# Patient Record
Sex: Male | Born: 1945 | Race: White | Hispanic: No | Marital: Married | Smoking: Former smoker
Health system: Southern US, Community
[De-identification: ages and names within clinical notes are randomized; demographics above are authoritative.]

## PROBLEM LIST (undated history)

## (undated) DIAGNOSIS — I700 Atherosclerosis of aorta: Secondary | ICD-10-CM

## (undated) DIAGNOSIS — I10 Essential (primary) hypertension: Secondary | ICD-10-CM

## (undated) DIAGNOSIS — I2510 Atherosclerotic heart disease of native coronary artery without angina pectoris: Secondary | ICD-10-CM

## (undated) DIAGNOSIS — R7301 Impaired fasting glucose: Secondary | ICD-10-CM

## (undated) DIAGNOSIS — E785 Hyperlipidemia, unspecified: Secondary | ICD-10-CM

## (undated) DIAGNOSIS — C4491 Basal cell carcinoma of skin, unspecified: Secondary | ICD-10-CM

## (undated) DIAGNOSIS — J449 Chronic obstructive pulmonary disease, unspecified: Secondary | ICD-10-CM

## (undated) DIAGNOSIS — G5600 Carpal tunnel syndrome, unspecified upper limb: Secondary | ICD-10-CM

## (undated) HISTORY — PX: FRACTURE SURGERY: SHX138

## (undated) HISTORY — DX: Atherosclerosis of aorta: I70.0

## (undated) HISTORY — DX: Basal cell carcinoma of skin, unspecified: C44.91

## (undated) HISTORY — DX: Chronic obstructive pulmonary disease, unspecified: J44.9

## (undated) HISTORY — PX: CARPAL TUNNEL RELEASE: SHX101

## (undated) HISTORY — DX: Hyperlipidemia, unspecified: E78.5

## (undated) HISTORY — DX: Carpal tunnel syndrome, unspecified upper limb: G56.00

## (undated) HISTORY — DX: Atherosclerotic heart disease of native coronary artery without angina pectoris: I25.10

## (undated) HISTORY — DX: Impaired fasting glucose: R73.01

---

## 2003-10-17 ENCOUNTER — Encounter: Admission: RE | Admit: 2003-10-17 | Discharge: 2003-10-17 | Payer: Self-pay | Admitting: Internal Medicine

## 2016-07-03 ENCOUNTER — Other Ambulatory Visit: Payer: Self-pay | Admitting: Gastroenterology

## 2016-09-08 ENCOUNTER — Ambulatory Visit (HOSPITAL_COMMUNITY)
Admission: RE | Admit: 2016-09-08 | Discharge: 2016-09-08 | Disposition: A | Discharge status: Home / Self Care | Payer: BLUE CROSS/BLUE SHIELD | Source: Ambulatory Visit | Attending: Gastroenterology | Admitting: Gastroenterology

## 2016-09-08 ENCOUNTER — Ambulatory Visit (HOSPITAL_COMMUNITY): Payer: BLUE CROSS/BLUE SHIELD | Admitting: Anesthesiology

## 2016-09-08 ENCOUNTER — Encounter (HOSPITAL_COMMUNITY)
Admission: RE | Disposition: A | Discharge status: Home / Self Care | Payer: Self-pay | Source: Ambulatory Visit | Attending: Gastroenterology

## 2016-09-08 ENCOUNTER — Encounter (HOSPITAL_COMMUNITY): Payer: Self-pay | Admitting: *Deleted

## 2016-09-08 DIAGNOSIS — Z1211 Encounter for screening for malignant neoplasm of colon: Secondary | ICD-10-CM | POA: Diagnosis not present

## 2016-09-08 DIAGNOSIS — I10 Essential (primary) hypertension: Secondary | ICD-10-CM | POA: Insufficient documentation

## 2016-09-08 DIAGNOSIS — Z7982 Long term (current) use of aspirin: Secondary | ICD-10-CM | POA: Insufficient documentation

## 2016-09-08 DIAGNOSIS — E7800 Pure hypercholesterolemia, unspecified: Secondary | ICD-10-CM | POA: Insufficient documentation

## 2016-09-08 DIAGNOSIS — J45909 Unspecified asthma, uncomplicated: Secondary | ICD-10-CM | POA: Insufficient documentation

## 2016-09-08 DIAGNOSIS — Z79899 Other long term (current) drug therapy: Secondary | ICD-10-CM | POA: Insufficient documentation

## 2016-09-08 DIAGNOSIS — F17200 Nicotine dependence, unspecified, uncomplicated: Secondary | ICD-10-CM | POA: Insufficient documentation

## 2016-09-08 HISTORY — PX: COLONOSCOPY WITH PROPOFOL: SHX5780

## 2016-09-08 HISTORY — DX: Essential (primary) hypertension: I10

## 2016-09-08 SURGERY — COLONOSCOPY WITH PROPOFOL
Anesthesia: Monitor Anesthesia Care

## 2016-09-08 MED ORDER — LACTATED RINGERS IV SOLN
INTRAVENOUS | Status: DC
  Administered 2016-09-08: 11:00:00 via INTRAVENOUS

## 2016-09-08 MED ORDER — LIDOCAINE 2% (20 MG/ML) 5 ML SYRINGE
INTRAMUSCULAR | Status: AC
  Filled 2016-09-08: qty 5

## 2016-09-08 MED ORDER — LIDOCAINE 2% (20 MG/ML) 5 ML SYRINGE
INTRAMUSCULAR | Status: DC | PRN
  Administered 2016-09-08: 60 mg via INTRAVENOUS

## 2016-09-08 MED ORDER — ONDANSETRON HCL 4 MG/2ML IJ SOLN
INTRAMUSCULAR | Status: AC
  Filled 2016-09-08: qty 2

## 2016-09-08 MED ORDER — PROPOFOL 10 MG/ML IV BOLUS
INTRAVENOUS | Status: DC | PRN
  Administered 2016-09-08 (×6): 20 mg via INTRAVENOUS
  Administered 2016-09-08: 40 mg via INTRAVENOUS
  Administered 2016-09-08 (×5): 20 mg via INTRAVENOUS

## 2016-09-08 MED ORDER — PROPOFOL 10 MG/ML IV BOLUS
INTRAVENOUS | Status: AC
  Filled 2016-09-08: qty 20

## 2016-09-08 MED ORDER — SODIUM CHLORIDE 0.9 % IV SOLN: INTRAVENOUS | Status: DC

## 2016-09-08 MED ORDER — ONDANSETRON HCL 4 MG/2ML IJ SOLN
INTRAMUSCULAR | Status: DC | PRN
  Administered 2016-09-08: 4 mg via INTRAVENOUS

## 2016-09-08 SURGICAL SUPPLY — 22 items

## 2016-09-08 NOTE — Anesthesia Postprocedure Evaluation (Signed)
Anesthesia Post Note  Patient: Phillip Lam  Procedure(s) Performed: Procedure(s) (LRB): COLONOSCOPY WITH PROPOFOL (N/A)     Patient location during evaluation: PACU Anesthesia Type: MAC Level of consciousness: awake and alert Pain management: pain level controlled Vital Signs Assessment: post-procedure vital signs reviewed and stable Respiratory status: spontaneous breathing, nonlabored ventilation, respiratory function stable and patient connected to nasal cannula oxygen Cardiovascular status: stable and blood pressure returned to baseline Anesthetic complications: no    Last Vitals:  Vitals:   09/08/16 1320 09/08/16 1330  BP: 127/79 127/67  Pulse: (!) 59 (!) 56  Resp: 14 12  Temp:      Last Pain:  Vitals:   09/08/16 1302  TempSrc: Oral                 [name]

## 2016-09-08 NOTE — H&P (Signed)
Procedure: Screening colonoscopy. 03/30/2006 normal screening colonoscopy was performed  History: The patient is a [age] year old male born September 04, 1945. He is scheduled to undergo a repeat screening colonoscopy today.  Past medical history: Hypertension. Asthma. Hypercholesterolemia. Carpal tunnel syndrome. Left elbow surgery. Bilateral carpal tunnel release surgeries.  Exam: The patient is alert and lying comfortably on the endoscopy stretcher. Abdomen is soft and nontender to palpation. Lungs are clear to auscultation. Cardiac exam reveals a regular rhythm.  Plan: Proceed with screening colonoscopy

## 2016-09-08 NOTE — Discharge Instructions (Signed)

## 2016-09-08 NOTE — Op Note (Signed)
[facility] Patient Name: Phillip Lam Procedure Date: 09/08/2016 MRN: [id number] Attending MD: Charolett Bumpers , MD Date of Birth: [birth year] CSN: [id number] Age: 71 Admit Type: Outpatient Procedure:                Colonoscopy Indications:              Screening for colorectal malignant neoplasm.                            03/30/2006 normal screening colonoscopy was performed. Providers:                Charolett Bumpers, MD, Carin Hock, RN, Beryle Beams, Technician, Doreene Burke, CRNA Referring MD:              Medicines:                Propofol per Anesthesia Complications:            No immediate complications. Estimated Blood Loss:     Estimated blood loss: none. Procedure:                Pre-Anesthesia Assessment:                           - Prior to the procedure, a History and Physical                            was performed, and patient medications and                            allergies were reviewed. The patient's tolerance of                            previous anesthesia was also reviewed. The risks                            and benefits of the procedure and the sedation                            options and risks were discussed with the patient.                            All questions were answered, and informed consent                            was obtained. Prior Anticoagulants: The patient has                            taken no previous anticoagulant or antiplatelet                            agents. ASA Grade Assessment: II - A patient with  mild systemic disease. After reviewing the risks                            and benefits, the patient was deemed in                            satisfactory condition to undergo the procedure.                           After obtaining informed consent, the colonoscope                            was passed under direct vision. Throughout the                    procedure, the patient's blood pressure, pulse, and                            oxygen saturations were monitored continuously. The                            EC-[id number] ([id number]) scope was introduced through                            the anus and advanced to the the cecum, identified                            by appendiceal orifice and ileocecal valve. The                            colonoscopy was performed without difficulty. The                            patient tolerated the procedure well. The quality                            of the bowel preparation was good. The ileocecal                            valve, the appendiceal orifice and the rectum were                            photographed. Scope In: 12:34:03 PM Scope Out: 12:55:02 PM Scope Withdrawal Time: 0 hours 12 minutes 4 seconds  Total Procedure Duration: 0 hours 20 minutes 59 seconds  Findings:      The perianal and digital rectal examinations were normal.      The entire examined colon appeared normal. Impression:               - The entire examined colon is normal.                           - No specimens collected. Moderate Sedation:      N/A- Per Anesthesia Care Recommendation:           - Patient has a contact number  available for                            emergencies. The signs and symptoms of potential                            delayed complications were discussed with the                            patient. Return to normal activities tomorrow.                            Written discharge instructions were provided to the                            patient.                           - Repeat colonoscopy is not recommended for                            screening purposes.                           - Resume previous diet.                           - Continue present medications. Procedure Code(s):        --- Professional ---                           [id number], Colorectal cancer screening;  colonoscopy on                            individual not meeting criteria for high risk Diagnosis Code(s):        --- Professional ---                           Z12.11, Encounter for screening for malignant                            neoplasm of colon CPT copyright 2016 American Medical Association. All rights reserved. The codes documented in this report are preliminary and upon coder review may  be revised to meet current compliance requirements. Danise EdgeMartin Maddix Heinz, MD Charolett BumpersMartin K Shyheem Whitham, MD 09/08/2016 1:00:49 PM This report has been signed electronically. Number of Addenda: 0

## 2016-09-08 NOTE — Anesthesia Preprocedure Evaluation (Addendum)
Anesthesia Evaluation  Patient identified by MRN, date of birth, ID band Patient awake    Reviewed: Allergy & Precautions, NPO status , Patient's Chart, lab work & pertinent test results  Airway Mallampati: I  TM Distance: >3 FB Neck ROM: Full    Dental  (+) Upper Dentures, Lower Dentures   Pulmonary Current Smoker,    Pulmonary exam normal        Cardiovascular hypertension, Pt. on medications  Rhythm:Regular Rate:Normal     Neuro/Psych negative neurological ROS  negative psych ROS   GI/Hepatic negative GI ROS, Neg liver ROS,   Endo/Other  negative endocrine ROS  Renal/GU negative Renal ROS  negative genitourinary   Musculoskeletal negative musculoskeletal ROS (+)   Abdominal   Peds negative pediatric ROS (+)  Hematology negative hematology ROS (+)   Anesthesia Other Findings   Reproductive/Obstetrics negative OB ROS                           Anesthesia Physical Anesthesia Plan  ASA: II  Anesthesia Plan: MAC   Post-op Pain Management:    Induction: Intravenous  PONV Risk Score and Plan: Propofol  Airway Management Planned:   Additional Equipment:   Intra-op Plan:   Post-operative Plan:   Informed Consent: I have reviewed the patients History and Physical, chart, labs and discussed the procedure including the risks, benefits and alternatives for the proposed anesthesia with the patient or authorized representative who has indicated his/her understanding and acceptance.     Plan Discussed with:   Anesthesia Plan Comments:         Anesthesia Quick Evaluation

## 2016-09-08 NOTE — Transfer of Care (Signed)
Immediate Anesthesia Transfer of Care Note  Patient: Phillip Lam  Procedure(s) Performed: Procedure(s): COLONOSCOPY WITH PROPOFOL (N/A)  Patient Location: PACU  Anesthesia Type:MAC  Level of Consciousness:  sedated, patient cooperative and responds to stimulation  Airway & Oxygen Therapy:Patient Spontanous Breathing and Patient connected to face mask oxgen  Post-op Assessment:  Report given to PACU RN and Post -op Vital signs reviewed and stable  Post vital signs:  Reviewed and stable  Last Vitals:  Vitals:   09/08/16 1120  BP: (!) 142/84  Pulse: 68  Resp: 16  Temp: 15.4 C    Complications: No apparent anesthesia complications

## 2016-09-10 ENCOUNTER — Encounter (HOSPITAL_COMMUNITY): Payer: Self-pay | Admitting: Gastroenterology

## 2016-12-25 DIAGNOSIS — H6982 Other specified disorders of Eustachian tube, left ear: Secondary | ICD-10-CM | POA: Diagnosis not present

## 2017-01-02 DIAGNOSIS — H6123 Impacted cerumen, bilateral: Secondary | ICD-10-CM | POA: Diagnosis not present

## 2017-01-02 DIAGNOSIS — H9192 Unspecified hearing loss, left ear: Secondary | ICD-10-CM | POA: Diagnosis not present

## 2017-01-02 DIAGNOSIS — H9202 Otalgia, left ear: Secondary | ICD-10-CM | POA: Insufficient documentation

## 2017-01-06 DIAGNOSIS — Z77122 Contact with and (suspected) exposure to noise: Secondary | ICD-10-CM | POA: Diagnosis not present

## 2017-01-06 DIAGNOSIS — H93292 Other abnormal auditory perceptions, left ear: Secondary | ICD-10-CM | POA: Insufficient documentation

## 2017-01-06 DIAGNOSIS — Z87891 Personal history of nicotine dependence: Secondary | ICD-10-CM | POA: Diagnosis not present

## 2017-01-06 DIAGNOSIS — H6122 Impacted cerumen, left ear: Secondary | ICD-10-CM | POA: Diagnosis not present

## 2017-04-20 ENCOUNTER — Other Ambulatory Visit: Payer: Self-pay | Admitting: Internal Medicine

## 2017-04-20 DIAGNOSIS — J449 Chronic obstructive pulmonary disease, unspecified: Secondary | ICD-10-CM | POA: Diagnosis not present

## 2017-04-20 DIAGNOSIS — R0609 Other forms of dyspnea: Secondary | ICD-10-CM | POA: Diagnosis not present

## 2017-04-20 DIAGNOSIS — I10 Essential (primary) hypertension: Secondary | ICD-10-CM | POA: Diagnosis not present

## 2017-04-20 DIAGNOSIS — E669 Obesity, unspecified: Secondary | ICD-10-CM | POA: Diagnosis not present

## 2017-04-20 DIAGNOSIS — Z0000 Encounter for general adult medical examination without abnormal findings: Secondary | ICD-10-CM | POA: Diagnosis not present

## 2017-04-20 DIAGNOSIS — L989 Disorder of the skin and subcutaneous tissue, unspecified: Secondary | ICD-10-CM | POA: Diagnosis not present

## 2017-04-20 DIAGNOSIS — Z1389 Encounter for screening for other disorder: Secondary | ICD-10-CM | POA: Diagnosis not present

## 2017-04-20 DIAGNOSIS — G479 Sleep disorder, unspecified: Secondary | ICD-10-CM | POA: Diagnosis not present

## 2017-04-20 DIAGNOSIS — E7800 Pure hypercholesterolemia, unspecified: Secondary | ICD-10-CM | POA: Diagnosis not present

## 2017-04-27 ENCOUNTER — Ambulatory Visit
Admission: RE | Admit: 2017-04-27 | Discharge: 2017-04-27 | Disposition: A | Discharge status: Home / Self Care | Payer: Medicare Other | Source: Ambulatory Visit | Attending: Internal Medicine | Admitting: Internal Medicine

## 2017-04-27 DIAGNOSIS — R0609 Other forms of dyspnea: Principal | ICD-10-CM

## 2017-04-27 DIAGNOSIS — I77811 Abdominal aortic ectasia: Secondary | ICD-10-CM | POA: Diagnosis not present

## 2017-04-28 ENCOUNTER — Other Ambulatory Visit: Payer: Self-pay | Admitting: Internal Medicine

## 2017-04-28 DIAGNOSIS — Z136 Encounter for screening for cardiovascular disorders: Secondary | ICD-10-CM

## 2017-05-04 DIAGNOSIS — R0609 Other forms of dyspnea: Secondary | ICD-10-CM | POA: Diagnosis not present

## 2017-11-10 DIAGNOSIS — I10 Essential (primary) hypertension: Secondary | ICD-10-CM | POA: Diagnosis not present

## 2017-11-10 DIAGNOSIS — R0609 Other forms of dyspnea: Secondary | ICD-10-CM | POA: Diagnosis not present

## 2017-11-10 DIAGNOSIS — J449 Chronic obstructive pulmonary disease, unspecified: Secondary | ICD-10-CM | POA: Diagnosis not present

## 2017-11-10 DIAGNOSIS — J45909 Unspecified asthma, uncomplicated: Secondary | ICD-10-CM | POA: Diagnosis not present

## 2017-11-10 DIAGNOSIS — E7800 Pure hypercholesterolemia, unspecified: Secondary | ICD-10-CM | POA: Diagnosis not present

## 2017-11-16 DIAGNOSIS — J449 Chronic obstructive pulmonary disease, unspecified: Secondary | ICD-10-CM | POA: Diagnosis not present

## 2017-11-16 DIAGNOSIS — J45909 Unspecified asthma, uncomplicated: Secondary | ICD-10-CM | POA: Diagnosis not present

## 2017-11-16 DIAGNOSIS — I10 Essential (primary) hypertension: Secondary | ICD-10-CM | POA: Diagnosis not present

## 2018-04-11 IMAGING — US US AORTA
1 series · 10 of 10 positions shown · non-contrast
Comparison: None.

CLINICAL DATA: 71-year-old male with hypertension. Initial
encounter.

EXAM:
ULTRASOUND OF ABDOMINAL AORTA
TECHNIQUE: Ultrasound examination of the abdominal aorta was performed to
evaluate for abdominal aortic aneurysm.

[Series 1: us aorta · 0.41mm/px · 10 of 10 slices shown]
[im 1/10]
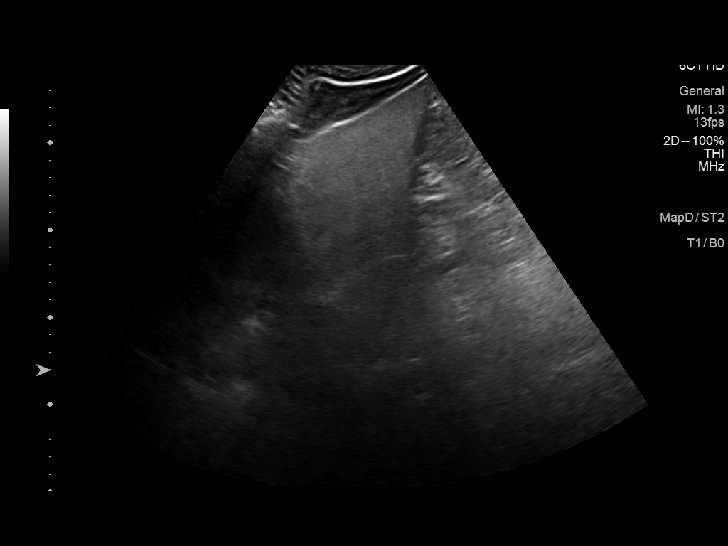
[im 2/10]
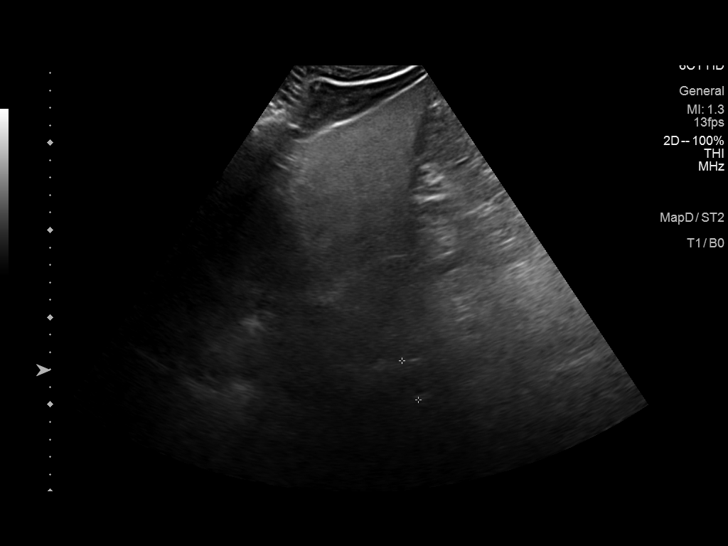
[im 3/10]
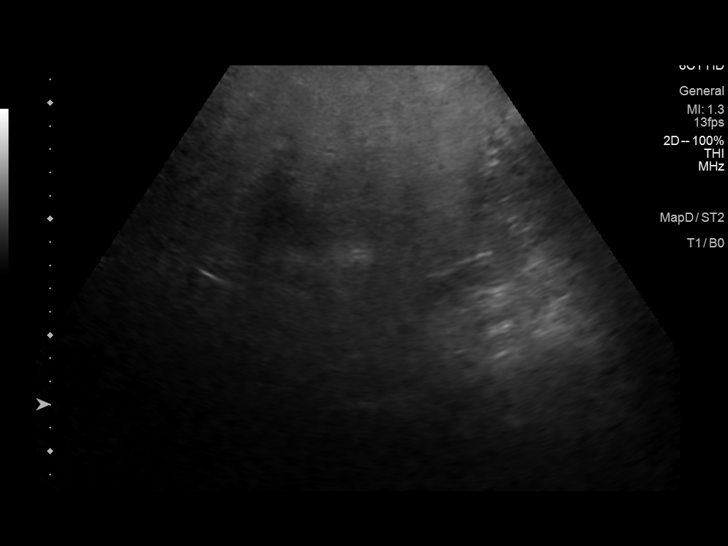
[im 4/10]
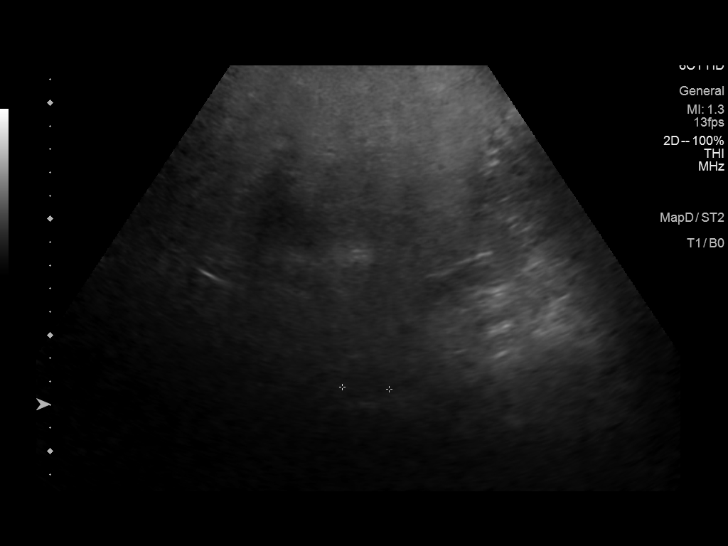
[im 5/10]
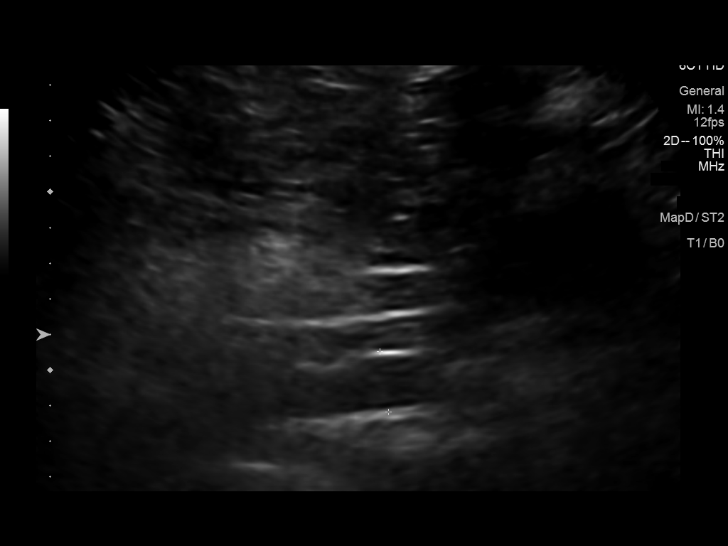
[im 6/10]
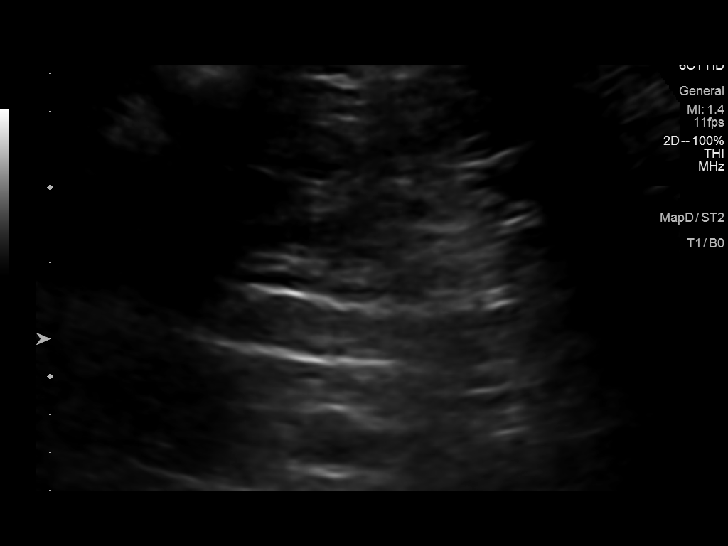
[im 7/10]
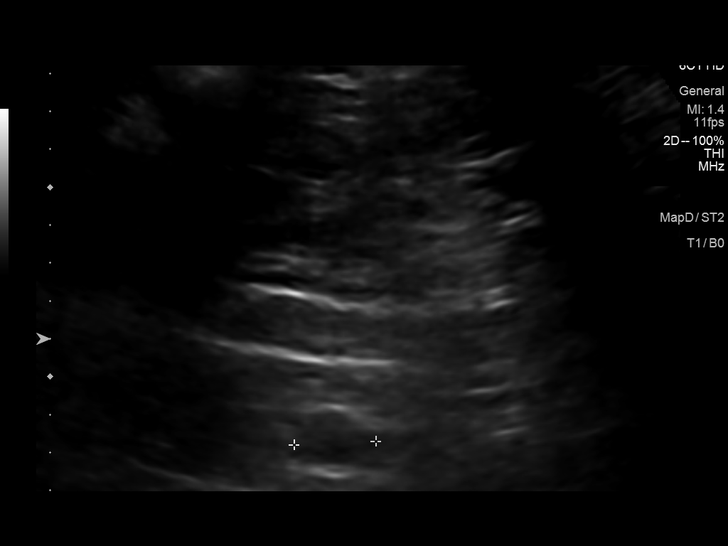
[im 8/10]
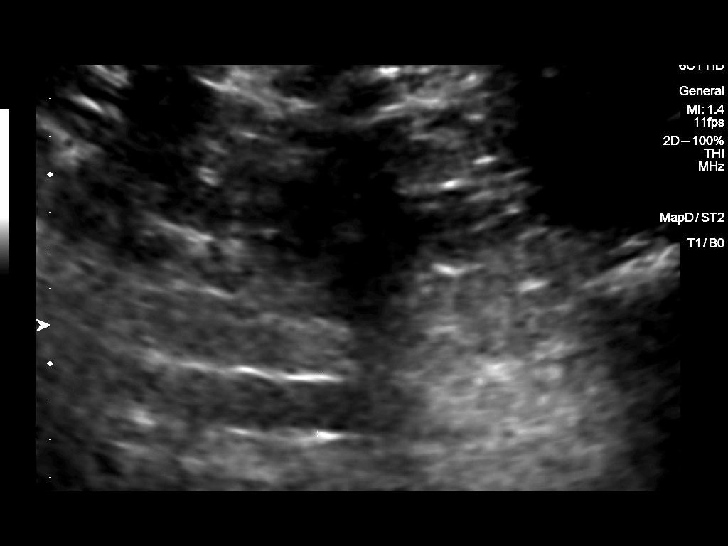
[im 9/10]
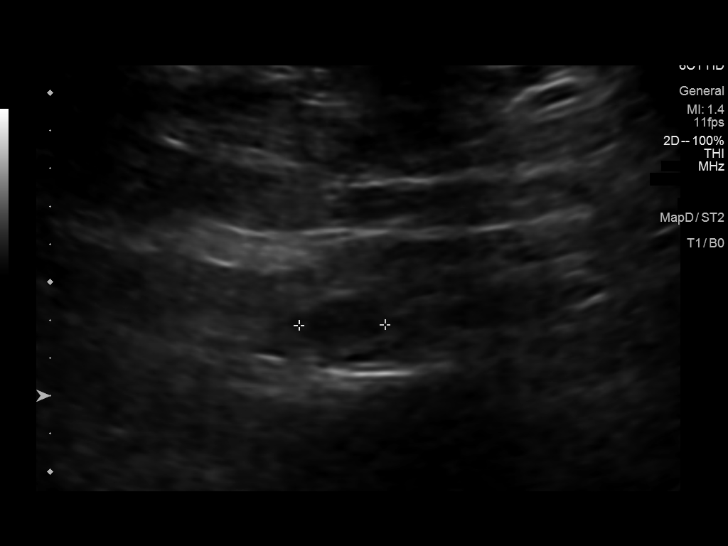
[im 10/10]
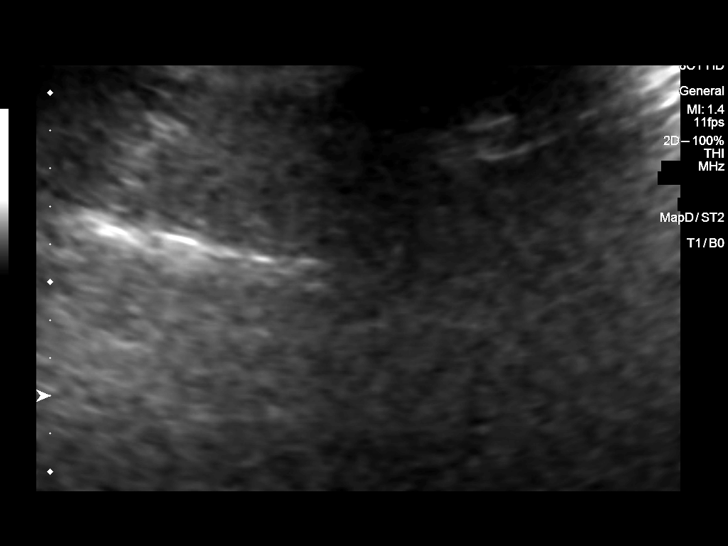

[10 of 10 positions shown; findings below may reference images not displayed]

FINDINGS: Abdominal aortic measurements as follows:

Proximal:  2.4 x 2 cm

Mid:  1.7 x 2.1 cm

Distal:  1.6 x 2.2 cm
IMPRESSION: Exam limited by habitus. Minimal ectasia distal abdominal aorta. No
abdominal aortic aneurysm identified.

## 2018-04-19 DIAGNOSIS — Z1159 Encounter for screening for other viral diseases: Secondary | ICD-10-CM | POA: Diagnosis not present

## 2018-04-19 DIAGNOSIS — J449 Chronic obstructive pulmonary disease, unspecified: Secondary | ICD-10-CM | POA: Diagnosis not present

## 2018-04-19 DIAGNOSIS — E7800 Pure hypercholesterolemia, unspecified: Secondary | ICD-10-CM | POA: Diagnosis not present

## 2018-04-19 DIAGNOSIS — J45909 Unspecified asthma, uncomplicated: Secondary | ICD-10-CM | POA: Diagnosis not present

## 2018-04-19 DIAGNOSIS — Z0000 Encounter for general adult medical examination without abnormal findings: Secondary | ICD-10-CM | POA: Diagnosis not present

## 2018-04-19 DIAGNOSIS — Z1389 Encounter for screening for other disorder: Secondary | ICD-10-CM | POA: Diagnosis not present

## 2018-04-19 DIAGNOSIS — I10 Essential (primary) hypertension: Secondary | ICD-10-CM | POA: Diagnosis not present

## 2018-06-23 DIAGNOSIS — R55 Syncope and collapse: Secondary | ICD-10-CM | POA: Diagnosis not present

## 2018-06-23 DIAGNOSIS — S0501XA Injury of conjunctiva and corneal abrasion without foreign body, right eye, initial encounter: Secondary | ICD-10-CM | POA: Diagnosis not present

## 2018-06-24 DIAGNOSIS — E119 Type 2 diabetes mellitus without complications: Secondary | ICD-10-CM | POA: Diagnosis not present

## 2018-06-24 DIAGNOSIS — N390 Urinary tract infection, site not specified: Secondary | ICD-10-CM | POA: Diagnosis not present

## 2018-06-24 DIAGNOSIS — K449 Diaphragmatic hernia without obstruction or gangrene: Secondary | ICD-10-CM | POA: Diagnosis not present

## 2018-06-24 DIAGNOSIS — S0501XA Injury of conjunctiva and corneal abrasion without foreign body, right eye, initial encounter: Secondary | ICD-10-CM | POA: Diagnosis not present

## 2018-06-30 DIAGNOSIS — J45909 Unspecified asthma, uncomplicated: Secondary | ICD-10-CM | POA: Diagnosis not present

## 2018-06-30 DIAGNOSIS — I10 Essential (primary) hypertension: Secondary | ICD-10-CM | POA: Diagnosis not present

## 2018-06-30 DIAGNOSIS — J449 Chronic obstructive pulmonary disease, unspecified: Secondary | ICD-10-CM | POA: Diagnosis not present

## 2018-08-02 DIAGNOSIS — J45909 Unspecified asthma, uncomplicated: Secondary | ICD-10-CM | POA: Diagnosis not present

## 2018-08-02 DIAGNOSIS — J449 Chronic obstructive pulmonary disease, unspecified: Secondary | ICD-10-CM | POA: Diagnosis not present

## 2018-08-02 DIAGNOSIS — I10 Essential (primary) hypertension: Secondary | ICD-10-CM | POA: Diagnosis not present

## 2018-10-15 DIAGNOSIS — J45909 Unspecified asthma, uncomplicated: Secondary | ICD-10-CM | POA: Diagnosis not present

## 2018-10-15 DIAGNOSIS — J449 Chronic obstructive pulmonary disease, unspecified: Secondary | ICD-10-CM | POA: Diagnosis not present

## 2018-10-15 DIAGNOSIS — I10 Essential (primary) hypertension: Secondary | ICD-10-CM | POA: Diagnosis not present

## 2019-02-28 DIAGNOSIS — Z23 Encounter for immunization: Secondary | ICD-10-CM | POA: Diagnosis not present

## 2019-04-22 DIAGNOSIS — I872 Venous insufficiency (chronic) (peripheral): Secondary | ICD-10-CM | POA: Diagnosis not present

## 2019-04-22 DIAGNOSIS — Z0000 Encounter for general adult medical examination without abnormal findings: Secondary | ICD-10-CM | POA: Diagnosis not present

## 2019-04-22 DIAGNOSIS — J45909 Unspecified asthma, uncomplicated: Secondary | ICD-10-CM | POA: Diagnosis not present

## 2019-04-22 DIAGNOSIS — Z6836 Body mass index (BMI) 36.0-36.9, adult: Secondary | ICD-10-CM | POA: Diagnosis not present

## 2019-04-22 DIAGNOSIS — I10 Essential (primary) hypertension: Secondary | ICD-10-CM | POA: Diagnosis not present

## 2019-04-22 DIAGNOSIS — Z1389 Encounter for screening for other disorder: Secondary | ICD-10-CM | POA: Diagnosis not present

## 2019-04-22 DIAGNOSIS — E7800 Pure hypercholesterolemia, unspecified: Secondary | ICD-10-CM | POA: Diagnosis not present

## 2020-04-26 DIAGNOSIS — E7800 Pure hypercholesterolemia, unspecified: Secondary | ICD-10-CM | POA: Diagnosis not present

## 2020-04-26 DIAGNOSIS — J449 Chronic obstructive pulmonary disease, unspecified: Secondary | ICD-10-CM | POA: Diagnosis not present

## 2020-04-26 DIAGNOSIS — Z0000 Encounter for general adult medical examination without abnormal findings: Secondary | ICD-10-CM | POA: Diagnosis not present

## 2020-04-26 DIAGNOSIS — J45909 Unspecified asthma, uncomplicated: Secondary | ICD-10-CM | POA: Diagnosis not present

## 2020-04-26 DIAGNOSIS — Z1389 Encounter for screening for other disorder: Secondary | ICD-10-CM | POA: Diagnosis not present

## 2020-04-26 DIAGNOSIS — I10 Essential (primary) hypertension: Secondary | ICD-10-CM | POA: Diagnosis not present

## 2020-04-26 DIAGNOSIS — Z6839 Body mass index (BMI) 39.0-39.9, adult: Secondary | ICD-10-CM | POA: Diagnosis not present

## 2020-05-28 DIAGNOSIS — Z5181 Encounter for therapeutic drug level monitoring: Secondary | ICD-10-CM | POA: Diagnosis not present

## 2020-11-12 DIAGNOSIS — H25813 Combined forms of age-related cataract, bilateral: Secondary | ICD-10-CM | POA: Diagnosis not present

## 2020-11-27 DIAGNOSIS — I10 Essential (primary) hypertension: Secondary | ICD-10-CM | POA: Diagnosis not present

## 2020-11-27 DIAGNOSIS — J45909 Unspecified asthma, uncomplicated: Secondary | ICD-10-CM | POA: Diagnosis not present

## 2020-11-27 DIAGNOSIS — R739 Hyperglycemia, unspecified: Secondary | ICD-10-CM | POA: Diagnosis not present

## 2020-11-27 DIAGNOSIS — J449 Chronic obstructive pulmonary disease, unspecified: Secondary | ICD-10-CM | POA: Diagnosis not present

## 2020-11-27 DIAGNOSIS — E7800 Pure hypercholesterolemia, unspecified: Secondary | ICD-10-CM | POA: Diagnosis not present

## 2021-04-30 DIAGNOSIS — J449 Chronic obstructive pulmonary disease, unspecified: Secondary | ICD-10-CM | POA: Diagnosis not present

## 2021-04-30 DIAGNOSIS — Z6836 Body mass index (BMI) 36.0-36.9, adult: Secondary | ICD-10-CM | POA: Diagnosis not present

## 2021-04-30 DIAGNOSIS — R7301 Impaired fasting glucose: Secondary | ICD-10-CM | POA: Diagnosis not present

## 2021-04-30 DIAGNOSIS — Z1389 Encounter for screening for other disorder: Secondary | ICD-10-CM | POA: Diagnosis not present

## 2021-04-30 DIAGNOSIS — Z0000 Encounter for general adult medical examination without abnormal findings: Secondary | ICD-10-CM | POA: Diagnosis not present

## 2021-04-30 DIAGNOSIS — I10 Essential (primary) hypertension: Secondary | ICD-10-CM | POA: Diagnosis not present

## 2021-04-30 DIAGNOSIS — J45909 Unspecified asthma, uncomplicated: Secondary | ICD-10-CM | POA: Diagnosis not present

## 2021-04-30 DIAGNOSIS — E7800 Pure hypercholesterolemia, unspecified: Secondary | ICD-10-CM | POA: Diagnosis not present

## 2021-05-01 ENCOUNTER — Other Ambulatory Visit: Payer: Self-pay | Admitting: Internal Medicine

## 2021-05-01 DIAGNOSIS — E7800 Pure hypercholesterolemia, unspecified: Secondary | ICD-10-CM

## 2021-05-21 DIAGNOSIS — R7301 Impaired fasting glucose: Secondary | ICD-10-CM | POA: Diagnosis not present

## 2021-05-31 ENCOUNTER — Other Ambulatory Visit: Payer: Self-pay

## 2021-05-31 ENCOUNTER — Ambulatory Visit
Admission: RE | Admit: 2021-05-31 | Discharge: 2021-05-31 | Disposition: A | Discharge status: Home / Self Care | Payer: No Typology Code available for payment source | Source: Ambulatory Visit | Attending: Internal Medicine | Admitting: Internal Medicine

## 2021-05-31 DIAGNOSIS — E7800 Pure hypercholesterolemia, unspecified: Secondary | ICD-10-CM | POA: Diagnosis not present

## 2021-07-12 ENCOUNTER — Ambulatory Visit (INDEPENDENT_AMBULATORY_CARE_PROVIDER_SITE_OTHER): Payer: PPO | Admitting: Internal Medicine

## 2021-07-12 ENCOUNTER — Encounter: Payer: Self-pay | Admitting: Internal Medicine

## 2021-07-12 VITALS — BP 130/74 | HR 67 | Ht 66.0 in | Wt 228.0 lb

## 2021-07-12 DIAGNOSIS — R0609 Other forms of dyspnea: Secondary | ICD-10-CM | POA: Diagnosis not present

## 2021-07-12 DIAGNOSIS — I700 Atherosclerosis of aorta: Secondary | ICD-10-CM

## 2021-07-12 DIAGNOSIS — R0789 Other chest pain: Secondary | ICD-10-CM | POA: Diagnosis not present

## 2021-07-12 DIAGNOSIS — I2510 Atherosclerotic heart disease of native coronary artery without angina pectoris: Secondary | ICD-10-CM | POA: Insufficient documentation

## 2021-07-12 DIAGNOSIS — I2584 Coronary atherosclerosis due to calcified coronary lesion: Secondary | ICD-10-CM | POA: Diagnosis not present

## 2021-07-12 DIAGNOSIS — E785 Hyperlipidemia, unspecified: Secondary | ICD-10-CM

## 2021-07-12 DIAGNOSIS — I10 Essential (primary) hypertension: Secondary | ICD-10-CM | POA: Diagnosis not present

## 2021-07-12 DIAGNOSIS — E782 Mixed hyperlipidemia: Secondary | ICD-10-CM | POA: Insufficient documentation

## 2021-07-12 NOTE — Patient Instructions (Addendum)
Medication Instructions:  ? ?Your physician recommends that you continue on your current medications as directed. Please refer to the Current Medication list given to you today. ? ?*If you need a refill on your cardiac medications before your next appointment, please call your pharmacy* ? ? ?Lab Work: ? ?None ordered ? ?Testing/Procedures: ? ?[facility] MYOVIEW ? ?Your caregiver has ordered a Stress Test with nuclear imaging (Lexiscan Myoview). The purpose of this test is to evaluate the blood supply to your heart muscle. This procedure is referred to as a "Non-Invasive Stress Test." This is because other than having an IV started in your vein, nothing is inserted or "invades" your body. Cardiac stress tests are done to find areas of poor blood flow to the heart by determining the extent of coronary artery disease (CAD). Some patients exercise on a treadmill, which naturally increases the blood flow to your heart, while others who are  unable to walk on a treadmill due to physical limitations have a pharmacologic/chemical stress agent called Lexiscan . This medicine will mimic walking on a treadmill by temporarily increasing your coronary blood flow.  ? ?Please note: these test may take anywhere between 2-4 hours to complete ? ?PLEASE REPORT TO [facility] MEDICAL MALL ENTRANCE  ?THE VOLUNTEERS AT THE FIRST DESK WILL DIRECT YOU WHERE TO GO ? ?Date of Procedure:_____________________________________ ? ?Arrival Time for Procedure:______________________________ ? ? ?PLEASE NOTIFY THE OFFICE AT LEAST 24 HOURS IN ADVANCE IF YOU ARE UNABLE TO KEEP YOUR APPOINTMENT.  [phone] ?AND  ?PLEASE NOTIFY NUCLEAR MEDICINE AT [facility] AT LEAST 41 HOURS IN ADVANCE IF YOU ARE UNABLE TO KEEP YOUR APPOINTMENT. [phone] ? ?How to prepare for your Myoview test: ? ?Do not eat or drink after midnight ?No caffeine for 24 hours prior to test ?No smoking 24 hours prior to test. ?Your medication may be taken with water.  If your doctor stopped a  medication because of this test, do not take that medication. ?Please wear a short sleeve shirt. ?No perfume, cologne or lotion. ? ? ?Follow-Up: ?At [facility], you and your health needs are our priority.  As part of our continuing mission to provide you with exceptional heart care, we have created designated Provider Care Teams.  These Care Teams include your primary Cardiologist (physician) and Advanced Practice Providers (APPs -  Physician Assistants and Nurse Practitioners) who all work together to provide you with the care you need, when you need it. ? ?We recommend signing up for the patient portal called "MyChart".  Sign up information is provided on this After Visit Summary.  MyChart is used to connect with patients for Virtual Visits (Telemedicine).  Patients are able to view lab/test results, encounter notes, upcoming appointments, etc.  Non-urgent messages can be sent to your provider as well.   ?To learn more about what you can do with MyChart, go to [email or web address].   ? ?Your next appointment:   ?3 month(s) ? ?The format for your next appointment:   ?In Person ? ?Provider:   ?You may see Dr. Harrell Gave End or one of the following Advanced Practice Providers on your designated Care Team:   ?Murray Hodgkins, NP ?Christell Faith, PA-C ?Cadence Kathlen Mody, PA-C  ? ? ?Other Instructions ? ?When you call your insurance for [facility] give them the following information: ? ?CPT code [phone] ?Dx: I25.10, I25.8, R06.02 ? ?Important Information About Sugar ? ? ? ? ? ? ?

## 2021-07-12 NOTE — Progress Notes (Signed)
? ?New Outpatient Visit ?Date: 07/12/2021 ? ?Referring Provider: ?Phillip Funk, MD ?[address] ?[address] ?[place],  [place] [address] ? ?Chief Complaint: Evaluation of coronary artery calcification ? ?HPI:  [name] is a 76 y.o. male who is being seen today for the evaluation of elevated coronary calcium score at the request of Dr. Valentina Lam. He has a history of hypertension, hyperlipidemia, impaired glucose tolerance, COPD, and obesity.  He underwent coronary calcium scoring last month, which was found to be high (CAC 416, 64th percentile for age and sex matched controls). ? ?Today, [name] reports that he is feeling fairly well.  He underwent coronary artery scoring in order to help him better assess his cardiovascular risk.  Given the elevated CAC, he was recently placed on rosuvastatin by Dr. Valentina Lam with plans for follow-up lipid panel in a few weeks.  [name] reports that he gets a little short of breath at times, which she has attributed to underlying COPD.  He feels like this has worsened since he retired 5 years ago.  He notes sporadic chest pains that are vague and often resolve after belching.  He does not exercise regularly but remains quite busy working in his garden and also caring for his disabled wife.  He has rare palpitations that last a few seconds and are without associated symptoms.  He has experienced some lightheadedness in the past when dehydrated; he tries to use drink plenty of fluids. ? ?[name] reports having undergone a stress test in his early 9s.  He does not recall exactly why the test was ordered but believes that it was normal (he was not notified of any abnormal findings). ? ?-------------------------------------------------------------------------------------------------- ? ?Cardiovascular History & Procedures: ?Cardiovascular Problems: ?Dyspnea on exertion ?Coronary artery calcification ?Aortic atherosclerosis ? ?Risk Factors: ?Hypertension, hyperlipidemia, male  gender, prior tobacco use, obesity, family history, and age greater than 7 ? ?Cath/PCI: ?None ? ?CV Surgery: ?None ? ?EP Procedures and Devices: ?None ? ?Non-Invasive Evaluation(s): ?Coronary calcium score (05/31/2021): Three-vessel coronary artery calcium, most pronounced in the LAD.  Coronary calcium score 416 (64th percentile).  Aortic atherosclerosis noted. ? ?-------------------------------------------------------------------------------------------------- ? ?Past Medical History:  ?Diagnosis Date  ? Aortic atherosclerosis (HCC)   ? Asthma   ? Carpal tunnel syndrome   ? Coronary artery calcification   ? Hyperlipidemia   ? Hypertension   ? Impaired fasting glucose   ? ? ?Past Surgical History:  ?Procedure Laterality Date  ? CARPAL TUNNEL RELEASE Bilateral   ? COLONOSCOPY WITH PROPOFOL N/A 09/08/2016  ? Procedure: COLONOSCOPY WITH PROPOFOL;  Surgeon: Charolett Bumpers, MD;  Location: [facility];  Service: Endoscopy;  Laterality: N/A;  ? FRACTURE SURGERY    ? left elbow  ? ? ?Current Meds  ?Medication Sig  ? acetaminophen (TYLENOL) 500 MG tablet as needed.  ? albuterol (PROVENTIL HFA;VENTOLIN HFA) 108 (90 Base) MCG/ACT inhaler Inhale 2 puffs into the lungs every 4 (four) hours as needed for wheezing or shortness of breath.  ? aspirin EC 81 MG tablet Take 81 mg by mouth daily.  ? budesonide-formoterol (SYMBICORT) 80-4.5 MCG/ACT inhaler Inhale 2 puffs into the lungs 2 (two) times daily.  ? lisinopril-hydrochlorothiazide (ZESTORETIC) 20-12.5 MG tablet Take 1 tablet by mouth daily.  ? Misc Natural Products (GLUCOSAMINE CHOND COMPLEX/MSM PO) Take 1 tablet by mouth daily.  ? Multiple Vitamins-Minerals (MULTIVITAMIN WITH MINERALS) tablet Take 1 tablet by mouth daily.  ? rosuvastatin (CRESTOR) 10 MG tablet Take 10 mg by mouth daily.  ? ? ?Allergies:  Patient has no known allergies. ? ?Social History  ? ?Tobacco Use  ? Smoking status: Former  ?  Types: Cigars  ? Smokeless tobacco: Never  ?Vaping Use  ? Vaping Use: Never  used  ?Substance Use Topics  ? Alcohol use: Not Currently  ? Drug use: Not Currently  ?  Comment: as a young adult  ? ? ?Family History  ?Problem Relation Age of Onset  ? Stroke Mother   ? Heart disease Father   ? Heart attack Son   ? ? ?Review of Systems: ?A 12-system review of systems was performed and was negative except as noted in the HPI. ? ?-------------------------------------------------------------------------------------------------- ? ?Physical Exam: ?BP 130/74 (BP Location: Right Arm, Patient Position: Sitting, Cuff Size: Large)   Pulse 67   Ht 5\' 6"  (1.676 m)   Wt 228 lb (103.4 kg)   SpO2 98%   BMI 36.80 kg/m?  ? ?General: NAD. ?HEENT: No conjunctival pallor or scleral icterus. Facemask in place. ?Neck: Supple without lymphadenopathy, thyromegaly, JVD, or HJR. No carotid bruit. ?Lungs: Normal work of breathing. Clear to auscultation bilaterally without wheezes or crackles. ?Heart: Regular rate and rhythm without murmurs, rubs, or gallops. Non-displaced PMI. ?Abd: Bowel sounds present. Soft, NT/ND without hepatosplenomegaly ?Ext: No lower extremity edema. Radial, PT, and DP pulses are 2+ bilaterally ?Skin: Warm and dry without rash. ?Neuro: CNIII-XII intact. Strength and fine-touch sensation intact in upper and lower extremities bilaterally. ?Psych: Normal mood and affect. ? ?EKG: Normal sinus rhythm with possible inferior infarct.  No prior tracing available for comparison. ? ?-------------------------------------------------------------------------------------------------- ? ?ASSESSMENT AND PLAN: ?Dyspnea on exertion, chest discomfort, and coronary artery calcification: ?[name] notes chronic exertional dyspnea and random episodes of vague chest discomfort that are not exertional.  He has attributed his symptoms to noncardiac reasons such as COPD and indigestion, respectively.  However, recent coronary calcium score was high, particularly involving the LAD.  His EKG also shows evidence of  possible inferior infarct, though low voltage in leads III and aVF makes this difficult to confirm.  I have recommended further ischemia evaluation.  We discussed various testing options including myocardial perfusion stress testing, coronary CTA, and catheterization.  Given the fairly significant calcium burden in the LAD, I think CTA would have suboptimal sensitivity and specificity.  We will therefore proceed with a pharmacologic myocardial perfusion stress test, as knee pain precludes exercise stress testing.  In the meantime, [name] should continue his current medications including aspirin and recently added rosuvastatin.  He should proceed with follow-up labs as scheduled through Dr. Cathey Endow office. ? ?Hypertension: ?Blood pressure upper normal today.  Continue current medications and ongoing follow-up with Dr. Jone Baseman. ? ?Hyperlipidemia and aortic atherosclerosis: ?As noted above, recent CTA showed both coronary artery calcification as well as aortic atherosclerosis.  In light of this, I recommend moderate-high intensity statin therapy, ideally with an LDL goal of less than 70.  We will continue rosuvastatin 10 mg daily for now with follow-up labs through Dr. Valentina Lam as previously arranged.  I would have a low threshold for escalation of rosuvastatin dosing if LDL target is not achieved. ? ?Shared Decision Making/Informed Consent ?The risks [chest pain, shortness of breath, cardiac arrhythmias, dizziness, blood pressure fluctuations, myocardial infarction, stroke/transient ischemic attack, nausea, vomiting, allergic reaction, radiation exposure, metallic taste sensation and life-threatening complications (estimated to be 1 in 10,000)], benefits (risk stratification, diagnosing coronary artery disease, treatment guidance) and alternatives of a nuclear stress test were discussed in detail with [name] and he  agrees to proceed. ? ?Follow-up: Return to clinic in 3 months, sooner if symptoms worsen or  significant abnormalities identified on MPI. ? ?Yvonne Kendallhristopher Kortney Schoenfelder, MD ?07/12/2021 ?9:36 AM ? ?

## 2021-07-16 DIAGNOSIS — E7800 Pure hypercholesterolemia, unspecified: Secondary | ICD-10-CM | POA: Diagnosis not present

## 2021-07-16 DIAGNOSIS — Z5181 Encounter for therapeutic drug level monitoring: Secondary | ICD-10-CM | POA: Diagnosis not present

## 2021-08-05 ENCOUNTER — Encounter
Admission: RE | Admit: 2021-08-05 | Discharge: 2021-08-05 | Disposition: A | Discharge status: Home / Self Care | Payer: PPO | Source: Ambulatory Visit | Attending: Internal Medicine | Admitting: Internal Medicine

## 2021-08-05 DIAGNOSIS — I2584 Coronary atherosclerosis due to calcified coronary lesion: Secondary | ICD-10-CM | POA: Insufficient documentation

## 2021-08-05 DIAGNOSIS — R0609 Other forms of dyspnea: Secondary | ICD-10-CM | POA: Insufficient documentation

## 2021-08-05 DIAGNOSIS — I2510 Atherosclerotic heart disease of native coronary artery without angina pectoris: Secondary | ICD-10-CM | POA: Diagnosis not present

## 2021-08-05 LAB — NM MYOCAR MULTI W/SPECT W/WALL MOTION / EF
Base ST Depression (mm): 0 mm
LV dias vol: 77 mL (ref 62–150)
LV sys vol: 21 mL
Nuc Stress EF: 73 %
Peak HR: 67 {beats}/min
Rest HR: 55 {beats}/min
Rest Nuclear Isotope Dose: 10.5 mCi
SDS: 0
SRS: 0
SSS: 0
ST Depression (mm): 0 mm
Stress Nuclear Isotope Dose: 30.6 mCi
TID: 0.9

## 2021-08-05 MED ORDER — TECHNETIUM TC 99M TETROFOSMIN IV KIT
10.4900 | PACK | Freq: Once | INTRAVENOUS | Status: AC | PRN
  Administered 2021-08-05: 10.49 via INTRAVENOUS

## 2021-08-05 MED ORDER — REGADENOSON 0.4 MG/5ML IV SOLN
0.4000 mg | Freq: Once | INTRAVENOUS | Status: AC
  Administered 2021-08-05: 0.4 mg via INTRAVENOUS

## 2021-08-06 ENCOUNTER — Telehealth: Payer: Self-pay | Admitting: *Deleted

## 2021-08-06 NOTE — Telephone Encounter (Signed)
-----   Message from Nelva Bush, MD sent at 08/05/2021  3:24 PM EDT ----- ?Please let Mr. Kasinger know that his stress test is normal without evidence of a significant narrowing/blockage.  I recommend that he continue his current medications to help prevent his coronary artery plaque/calcium from progressing. ?

## 2021-08-06 NOTE — Telephone Encounter (Signed)
Attempted to call pt with results. No answer.  ?Voicemail states pt is screening calls and will call back.  ?Lmtcb.  ? ?

## 2021-08-06 NOTE — Telephone Encounter (Signed)
The patient has been notified of the result and verbalized understanding.  All questions (if any) were answered.     

## 2021-08-06 NOTE — Telephone Encounter (Signed)
Patient is returning call.  °

## 2021-09-18 DIAGNOSIS — M1712 Unilateral primary osteoarthritis, left knee: Secondary | ICD-10-CM | POA: Diagnosis not present

## 2021-09-18 DIAGNOSIS — M25562 Pain in left knee: Secondary | ICD-10-CM | POA: Diagnosis not present

## 2021-09-18 DIAGNOSIS — I872 Venous insufficiency (chronic) (peripheral): Secondary | ICD-10-CM | POA: Insufficient documentation

## 2021-10-18 ENCOUNTER — Encounter: Payer: Self-pay | Admitting: Internal Medicine

## 2021-10-18 ENCOUNTER — Ambulatory Visit (INDEPENDENT_AMBULATORY_CARE_PROVIDER_SITE_OTHER): Payer: PPO | Admitting: Internal Medicine

## 2021-10-18 ENCOUNTER — Telehealth: Payer: Self-pay | Admitting: *Deleted

## 2021-10-18 VITALS — BP 120/70 | HR 69 | Ht 66.5 in | Wt 230.0 lb

## 2021-10-18 DIAGNOSIS — J449 Chronic obstructive pulmonary disease, unspecified: Secondary | ICD-10-CM

## 2021-10-18 DIAGNOSIS — E785 Hyperlipidemia, unspecified: Secondary | ICD-10-CM

## 2021-10-18 DIAGNOSIS — R0609 Other forms of dyspnea: Secondary | ICD-10-CM | POA: Diagnosis not present

## 2021-10-18 DIAGNOSIS — I700 Atherosclerosis of aorta: Secondary | ICD-10-CM | POA: Diagnosis not present

## 2021-10-18 DIAGNOSIS — I2510 Atherosclerotic heart disease of native coronary artery without angina pectoris: Secondary | ICD-10-CM

## 2021-10-18 DIAGNOSIS — I10 Essential (primary) hypertension: Secondary | ICD-10-CM | POA: Diagnosis not present

## 2021-10-18 DIAGNOSIS — Z79899 Other long term (current) drug therapy: Secondary | ICD-10-CM

## 2021-10-18 NOTE — Patient Instructions (Signed)
Medication Instructions:   Your physician recommends that you continue on your current medications as directed. Please refer to the Current Medication list given to you today.  *If you need a refill on your cardiac medications before your next appointment, please call your pharmacy*   Lab Work:  None orderede  Testing/Procedures:  None ordered   Follow-Up: At [facility], you and your health needs are our priority.  As part of our continuing mission to provide you with exceptional heart care, we have created designated Provider Care Teams.  These Care Teams include your primary Cardiologist (physician) and Advanced Practice Providers (APPs -  Physician Assistants and Nurse Practitioners) who all work together to provide you with the care you need, when you need it.  We recommend signing up for the patient portal called "MyChart".  Sign up information is provided on this After Visit Summary.  MyChart is used to connect with patients for Virtual Visits (Telemedicine).  Patients are able to view lab/test results, encounter notes, upcoming appointments, etc.  Non-urgent messages can be sent to your provider as well.   To learn more about what you can do with MyChart, go to [email or web address].    Your next appointment:   6 month(s)  The format for your next appointment:   In Person  Provider:   You may see Dr. Cristal Deer End or one of the following Advanced Practice Providers on your designated Care Team:   Nicolasa Ducking, NP Eula Listen, PA-C Cadence Fransico Michael, [place]  Important Information About Sugar

## 2021-10-18 NOTE — Telephone Encounter (Signed)
-----   Message from Yvonne Kendall, MD sent at 10/18/2021  2:54 PM EDT ----- Please let Phillip Lam know that I have reviewed his most recent labs from Dr. Jone Baseman office.  This his lipid were better, his LDL of 91 is still a little higher than I recommend given his degree of coronary artery calcification.  I recommend that he increase rosuvastatin to 20 mg daily and that we recheck a fasting lipid panel and ALT in ~3 months.

## 2021-10-18 NOTE — Telephone Encounter (Signed)
Attempted to call pt. No answer. Lmtcb.  

## 2021-10-18 NOTE — Progress Notes (Signed)
Follow-up Outpatient Visit Date: 10/18/2021  Primary Care Provider: Lavone Orn, MD [place] [address] [place] [address]  Chief Complaint: Follow-up shortness of breath and coronary artery calcification  HPI:  Mr. Goens is a 76 y.o. male with history of hypertension, hyperlipidemia, impaired glucose tolerance, COPD, and obesity, who presents for follow-up of coronary artery calcification.  I met Mr. He in April for evaluation of elevated coronary calcium score identified by his PCP.  At our visit, his troponin was feeling well other than mild dyspnea that he attributed to his COPD.  It had worsened since he retired 5 years ago.  He also reported vague chest discomfort that improved after belching.  We agreed to obtain a pharmacologic MPI, which was low risk without evidence of ischemia or scar.  Today, Mr. Dehaas reports that he feels fairly well.  He has had 1 or 2 episodes over the last few years where he did not quite "feel right."  This typically resolves if he would drink more water.  He has not had any episodes for at least a year or two.  He denies chest pain, palpitations, lightheadedness, and edema.  He sometimes reports transient vision changes, almost as if he is looking through a kaleidoscope in his peripheral vision.  He has not had any other focal neurologic deficits.  His chronic exertional dyspnea, which she attributes to COPD, is stable.  He is planning to speak with his PCP about further treatment options for his obstructive lung disease as well as possible referral to pulmonary.  He ultimately would like to transition to a new PCP in [facility] with Dr. Delene Ruffini upcoming retirement.  --------------------------------------------------------------------------------------------------  Cardiovascular History & Procedures: Cardiovascular Problems: Dyspnea on exertion Coronary artery calcification Aortic atherosclerosis   Risk Factors: Hypertension,  hyperlipidemia, male gender, prior tobacco use, obesity, family history, and age greater than 25   Cath/PCI: None   CV Surgery: None   EP Procedures and Devices: None   Non-Invasive Evaluation(s): Pharmacologic MPI (08/05/2021): Low risk study without evidence of ischemia or scar.  LVEF greater than 65%. Coronary calcium score (05/31/2021): Three-vessel coronary artery calcium, most pronounced in the LAD.  Coronary calcium score 416 (64th percentile).  Aortic atherosclerosis noted.  Past medical and surgical history were reviewed and updated in EPIC.  Current Meds  Medication Sig   acetaminophen (TYLENOL) 500 MG tablet as needed.   albuterol (PROVENTIL HFA;VENTOLIN HFA) 108 (90 Base) MCG/ACT inhaler Inhale 2 puffs into the lungs every 4 (four) hours as needed for wheezing or shortness of breath.   aspirin EC 81 MG tablet Take 81 mg by mouth daily.   budesonide-formoterol (SYMBICORT) 80-4.5 MCG/ACT inhaler Inhale 2 puffs into the lungs 2 (two) times daily.   lisinopril-hydrochlorothiazide (ZESTORETIC) 20-12.5 MG tablet Take 1 tablet by mouth daily.   Multiple Vitamins-Minerals (MULTIVITAMIN WITH MINERALS) tablet Take 1 tablet by mouth daily.   rosuvastatin (CRESTOR) 10 MG tablet Take 10 mg by mouth daily.    Allergies: Patient has no known allergies.  Social History   Tobacco Use   Smoking status: Former    Types: Cigars    Quit date: 2019    Years since quitting: 4.5   Smokeless tobacco: Never  Vaping Use   Vaping Use: Never used  Substance Use Topics   Alcohol use: Not Currently   Drug use: Not Currently    Family History  Problem Relation Age of Onset   Stroke Mother 36   Stroke Father  90   Heart disease Father    Heart attack Brother    Coronary artery disease Brother 59   Heart attack Son 50    Review of Systems: A 12-system review of systems was performed and was negative except as noted in the  HPI.  --------------------------------------------------------------------------------------------------  Physical Exam: BP 120/70 (BP Location: Left Arm, Patient Position: Sitting, Cuff Size: Large)   Pulse 69   Ht 5' 6.5" (1.689 m)   Wt 230 lb (104.3 kg)   SpO2 97%   BMI 36.57 kg/m   General:  NAD. Neck: No JVD or HJR. Lungs: Clear to auscultation bilaterally without wheezes or crackles. Heart: Regular rate and rhythm without murmurs, rubs, or gallops. Abdomen: Soft, nontender, nondistended. Extremities: No lower extremity edema.  --------------------------------------------------------------------------------------------------  ASSESSMENT AND PLAN: Coronary artery disease, dyspnea on exertion, and COPD: Mr. Boen was noted to have high coronary calcium score though myocardial perfusion stress test after our last visit was low risk without evidence of ischemia or scar.  He believes that his exertional dyspnea is primarily driven by his underlying COPD, which certainly could be the case.  We discussed obtaining an echocardiogram to exclude concurrent structural abnormalities, though it should be noted that LVEF was greater than 65% on gated MPI images.  Mr. Malczewski wishes to defer any additional testing until he is able to speak with Dr. Laurann Montana next week.  We will request most recent labs from Dr. Delene Ruffini office to ensure that lipids are adequately controlled.  Continue low-dose aspirin therapy.  Hypertension: Blood pressure well controlled today.  No medication changes at this time.  Hyperlipidemia and aortic atherosclerosis: Continue rosuvastatin 10 mg daily for now.  I will request the most recent lipid panel results from Dr. Delene Ruffini office.  I would favor targeting an LDL of 70 or below; this may necessitate escalation of rosuvastatin.  Follow-up: Return to clinic in 6 months.  Nelva Bush, MD 10/18/2021 10:53 AM

## 2021-10-21 ENCOUNTER — Encounter: Payer: Self-pay | Admitting: Internal Medicine

## 2021-10-23 NOTE — Telephone Encounter (Signed)
Attempted to call pt. Message stating pt screens calls and left name and calling from [facility]. No option to leave voicemail. Will try to reach pt at later time.

## 2021-10-24 NOTE — Telephone Encounter (Signed)
Attempted to call pt. No answer. Lmtcb x 2.  

## 2021-10-24 NOTE — Telephone Encounter (Signed)
-----   Message from Yvonne Kendall, MD sent at 10/18/2021  2:54 PM EDT ----- Please let [name] know that I have reviewed his most recent labs from Dr. Jone Baseman office.  This his lipid were better, his LDL of 91 is still a little higher than I recommend given his degree of coronary artery calcification.  I recommend that he increase rosuvastatin to 20 mg daily and that we recheck a fasting lipid panel and ALT in ~3 months.

## 2021-10-25 ENCOUNTER — Encounter: Payer: Self-pay | Admitting: Internal Medicine

## 2021-10-25 MED ORDER — ROSUVASTATIN CALCIUM 20 MG PO TABS: 20.0000 mg | ORAL_TABLET | Freq: Every day | ORAL | 0 refills | Status: DC

## 2021-10-25 NOTE — Telephone Encounter (Signed)
Pt notified and pt voiced understanding.

## 2021-10-25 NOTE — Telephone Encounter (Signed)
Error

## 2021-10-25 NOTE — Telephone Encounter (Signed)
That is fine.  Let's have him continue rosuvastatin 20 mg daily and recheck lipids/ALT in early September.  Yvonne Kendall, MD [facility]

## 2021-10-25 NOTE — Telephone Encounter (Signed)
Pt returned call to notify me after we last spoke, he realized he was actually already on rosuvastatin 20 mg daily. Reported at last ov that he was on rosuvastatin 10 mg.   Pt states that after labs completed from 07/16/21 Dr. Valentina Lucks had pt incr rosuvastatin to 20 mg and that change was made on 08/20/21. Pt states he started incr dose of rosuvastatin 20 mg daily ~first week of June.   Will verify with Dr. Okey Dupre to have repeat lipid/ ALT early September for 3 month labs.

## 2021-10-25 NOTE — Telephone Encounter (Signed)
Pt notified of lab results and Dr. Serita Kyle recc. Pt voiced understanding.  Pt will:  - Increase rosuvastatin to 20 mg daily  - Pt will double current dose of 10 mg tablets and I have sent in new Rx  - Repeat fasting lipid panel and ALT in 3 months - Pt understands to arrive to medical mall at [facility] early November for fasting labs - Orders placed  Pt has no further questions at this time.

## 2021-10-29 DIAGNOSIS — I2510 Atherosclerotic heart disease of native coronary artery without angina pectoris: Secondary | ICD-10-CM | POA: Diagnosis not present

## 2021-10-29 DIAGNOSIS — I10 Essential (primary) hypertension: Secondary | ICD-10-CM | POA: Diagnosis not present

## 2021-10-29 DIAGNOSIS — E7800 Pure hypercholesterolemia, unspecified: Secondary | ICD-10-CM | POA: Diagnosis not present

## 2021-10-29 DIAGNOSIS — J45909 Unspecified asthma, uncomplicated: Secondary | ICD-10-CM | POA: Diagnosis not present

## 2021-10-29 DIAGNOSIS — R931 Abnormal findings on diagnostic imaging of heart and coronary circulation: Secondary | ICD-10-CM | POA: Diagnosis not present

## 2021-10-29 DIAGNOSIS — J449 Chronic obstructive pulmonary disease, unspecified: Secondary | ICD-10-CM | POA: Diagnosis not present

## 2021-10-29 DIAGNOSIS — R7301 Impaired fasting glucose: Secondary | ICD-10-CM | POA: Diagnosis not present

## 2021-11-22 ENCOUNTER — Telehealth: Payer: Self-pay | Admitting: *Deleted

## 2021-11-22 DIAGNOSIS — M5136 Other intervertebral disc degeneration, lumbar region: Secondary | ICD-10-CM | POA: Insufficient documentation

## 2021-11-22 DIAGNOSIS — M25561 Pain in right knee: Secondary | ICD-10-CM | POA: Diagnosis not present

## 2021-11-22 NOTE — Telephone Encounter (Signed)
I called patient about Victorion-1 Prevention Study. I was trying to call patient to discuss study. I was unble to reach patient.

## 2022-01-29 DIAGNOSIS — M25562 Pain in left knee: Secondary | ICD-10-CM | POA: Diagnosis not present

## 2022-01-29 DIAGNOSIS — M1712 Unilateral primary osteoarthritis, left knee: Secondary | ICD-10-CM | POA: Diagnosis not present

## 2022-01-29 DIAGNOSIS — G8929 Other chronic pain: Secondary | ICD-10-CM | POA: Diagnosis not present

## 2022-01-30 ENCOUNTER — Other Ambulatory Visit: Payer: Self-pay | Admitting: Sports Medicine

## 2022-01-30 DIAGNOSIS — M1712 Unilateral primary osteoarthritis, left knee: Secondary | ICD-10-CM

## 2022-01-30 DIAGNOSIS — G8929 Other chronic pain: Secondary | ICD-10-CM

## 2022-02-17 ENCOUNTER — Ambulatory Visit
Admission: RE | Admit: 2022-02-17 | Discharge: 2022-02-17 | Disposition: A | Discharge status: Home / Self Care | Payer: PPO | Source: Ambulatory Visit | Attending: Sports Medicine | Admitting: Sports Medicine

## 2022-02-17 DIAGNOSIS — G8929 Other chronic pain: Secondary | ICD-10-CM | POA: Insufficient documentation

## 2022-02-17 DIAGNOSIS — M25562 Pain in left knee: Secondary | ICD-10-CM | POA: Diagnosis not present

## 2022-02-17 DIAGNOSIS — S83242A Other tear of medial meniscus, current injury, left knee, initial encounter: Secondary | ICD-10-CM | POA: Diagnosis not present

## 2022-02-17 DIAGNOSIS — M25462 Effusion, left knee: Secondary | ICD-10-CM | POA: Diagnosis not present

## 2022-02-17 DIAGNOSIS — M1712 Unilateral primary osteoarthritis, left knee: Secondary | ICD-10-CM | POA: Insufficient documentation

## 2022-02-24 DIAGNOSIS — M25562 Pain in left knee: Secondary | ICD-10-CM | POA: Diagnosis not present

## 2022-02-24 DIAGNOSIS — G8929 Other chronic pain: Secondary | ICD-10-CM | POA: Diagnosis not present

## 2022-02-24 DIAGNOSIS — M25462 Effusion, left knee: Secondary | ICD-10-CM | POA: Diagnosis not present

## 2022-02-24 DIAGNOSIS — M1712 Unilateral primary osteoarthritis, left knee: Secondary | ICD-10-CM | POA: Diagnosis not present

## 2022-02-24 DIAGNOSIS — M23204 Derangement of unspecified medial meniscus due to old tear or injury, left knee: Secondary | ICD-10-CM | POA: Diagnosis not present

## 2022-05-05 ENCOUNTER — Encounter: Payer: Self-pay | Admitting: Emergency Medicine

## 2022-05-05 ENCOUNTER — Other Ambulatory Visit: Payer: Self-pay

## 2022-05-05 DIAGNOSIS — Z20822 Contact with and (suspected) exposure to covid-19: Secondary | ICD-10-CM | POA: Diagnosis not present

## 2022-05-05 DIAGNOSIS — R5381 Other malaise: Secondary | ICD-10-CM | POA: Diagnosis not present

## 2022-05-05 DIAGNOSIS — I10 Essential (primary) hypertension: Secondary | ICD-10-CM | POA: Diagnosis not present

## 2022-05-05 DIAGNOSIS — K625 Hemorrhage of anus and rectum: Secondary | ICD-10-CM | POA: Diagnosis not present

## 2022-05-05 LAB — COMPREHENSIVE METABOLIC PANEL
ALT: 39 U/L (ref 0–44)
AST: 21 U/L (ref 15–41)
Albumin: 4.6 g/dL (ref 3.5–5.0)
Alkaline Phosphatase: 40 U/L (ref 38–126)
Anion gap: 12 (ref 5–15)
BUN: 23 mg/dL (ref 8–23)
CO2: 23 mmol/L (ref 22–32)
Calcium: 10 mg/dL (ref 8.9–10.3)
Chloride: 101 mmol/L (ref 98–111)
Creatinine, Ser: 1.04 mg/dL (ref 0.61–1.24)
GFR, Estimated: >60 mL/min (ref >60)
Glucose, Bld: 119 mg/dL — ABNORMAL HIGH (ref 70–99)
Potassium: 3.7 mmol/L (ref 3.5–5.1)
Sodium: 136 mmol/L (ref 135–145)
Total Bilirubin: 0.7 mg/dL (ref 0.3–1.2)
Total Protein: 7.6 g/dL (ref 6.5–8.1)

## 2022-05-05 LAB — CBC
HCT: 42.6 % (ref 39.0–52.0)
Hemoglobin: 14.1 g/dL (ref 13.0–17.0)
MCH: 29.9 pg (ref 26.0–34.0)
MCHC: 33.1 g/dL (ref 30.0–36.0)
MCV: 90.4 fL (ref 80.0–100.0)
Platelets: 266 10*3/uL (ref 150–400)
RBC: 4.71 MIL/uL (ref 4.22–5.81)
RDW: 13.1 % (ref 11.5–15.5)
WBC: 12.2 10*3/uL — ABNORMAL HIGH (ref 4.0–10.5)
nRBC: 0 % (ref 0.0–0.2)

## 2022-05-05 NOTE — ED Triage Notes (Signed)
Pt to ED via EMS from home with multiple complaints.  States has not been feeling well since he woke up this morning.  States had BM this morning and noticed bright red blood in toilet.  States checked his BP at home today and highest was 200/100.  Felt chills but denies having fever.  Denies pain or SOB.  Pt A&Ox4, chest rise even and unlabored, skin WNL and in NAD at this time.

## 2022-05-05 NOTE — ED Triage Notes (Signed)
EMS brings pt in from home for c/o "not feeling well" today

## 2022-05-06 ENCOUNTER — Telehealth: Payer: Self-pay

## 2022-05-06 ENCOUNTER — Emergency Department: Payer: PPO

## 2022-05-06 ENCOUNTER — Emergency Department
Admission: EM | Admit: 2022-05-06 | Discharge: 2022-05-06 | Disposition: A | Discharge status: Home / Self Care | Payer: PPO | Attending: Emergency Medicine | Admitting: Emergency Medicine

## 2022-05-06 DIAGNOSIS — R5381 Other malaise: Secondary | ICD-10-CM

## 2022-05-06 DIAGNOSIS — K625 Hemorrhage of anus and rectum: Secondary | ICD-10-CM | POA: Diagnosis not present

## 2022-05-06 DIAGNOSIS — I10 Essential (primary) hypertension: Secondary | ICD-10-CM

## 2022-05-06 LAB — RESP PANEL BY RT-PCR (RSV, FLU A&B, COVID)  RVPGX2
Influenza A by PCR: NEGATIVE
Influenza B by PCR: NEGATIVE
Resp Syncytial Virus by PCR: NEGATIVE
SARS Coronavirus 2 by RT PCR: NEGATIVE

## 2022-05-06 NOTE — ED Provider Notes (Signed)
[facility] Provider Note    Event Date/Time   First MD Initiated Contact with Patient 05/06/22 0201     (approximate)   History   Hypertension and Rectal Bleeding   HPI  Phillip Lam is a 77 y.o. male who is generally healthy and active for his age.  He presents for evaluation of general malaise that started earlier today with additional concerns for high blood pressure and rectal bleeding.  He said that he just has felt "off" all day without any specific pain or discomfort.  He started to wonder if perhaps his blood pressure was high and he checked his blood pressure numerous times over the course of the day, at 1 point checking his frequently as every 30 minutes.  He said that eventually he realized that every time he checked it it was going higher which was making him more concerned.  He is not sure if it is related, but he also had a bowel movement today and had a significant mount of blood in the toilet.  He is having no abdominal pain and no bowel urgency, and he states that he has had blood in his stool in the past, but not usually this much.  He has no rectal pain and does not think he has any hemorrhoids.  He has had no chest pain or shortness of breath today.  He denies fever, dysuria, nausea, and vomiting.  He was mostly concerned about his blood pressure which she states got as high as about 200/100.  It has come down since he has been here.  Normally his blood pressure is very much within normal limits.  He has felt better since coming to the emergency department and his blood pressure has come down without intervention to almost a normal level for him.     Physical Exam   Triage Vital Signs: ED Triage Vitals [05/05/22 2158]  Enc Vitals Group     BP (!) 133/111     Pulse Rate 67     Resp 16     Temp 98.6 F (37 C)     Temp Source Oral     SpO2 96 %     Weight 102.1 kg (225 lb)     Height 1.702 m (5' 7"$ )     Head Circumference      Peak  Flow      Pain Score 0     Pain Loc      Pain Edu?      Excl. in [place]?     Most recent vital signs: Vitals:   05/06/22 0400 05/06/22 0432  BP: (!) 142/82   Pulse: (!) 55   Resp: 16   Temp:  98.1 F (36.7 C)  SpO2: 96%      General: Awake, no distress.  Well-appearing, appears younger than chronological age. CV:  Good peripheral perfusion.  Normal heart sounds, regular rate and rhythm. Resp:  Normal effort. Speaking easily and comfortably, no accessory muscle usage nor intercostal retractions.  Lungs are clear to auscultation. Abd:  No distention.  No tenderness to palpation.  No pulsatile masses in the abdomen. Other:  Patient is a good spirits, conversant, appropriate mood and affect.  Declines rectal exam.   ED Results / Procedures / Treatments   Labs (all labs ordered are listed, but only abnormal results are displayed) Labs Reviewed  COMPREHENSIVE METABOLIC PANEL - Abnormal; Notable for the following components:      Result Value  Glucose, Bld 119 (*)    All other components within normal limits  CBC - Abnormal; Notable for the following components:   WBC 12.2 (*)    All other components within normal limits  RESP PANEL BY RT-PCR (RSV, FLU A&B, COVID)  [id number]  URINALYSIS, ROUTINE W REFLEX MICROSCOPIC     EKG  ED ECG REPORT I, Hinda Kehr, the attending physician, personally viewed and interpreted this ECG.  Date: 05/05/2022 EKG Time: 22: 00 Rate: 66 Rhythm: normal sinus rhythm QRS Axis: normal Intervals: normal ST/T Wave abnormalities: normal Narrative Interpretation: no evidence of acute ischemia    RADIOLOGY I viewed and interpreted the patient's two-view chest x-ray.  I see no evidence of pneumonia, widened mediastinum, nor other acute abnormality.  I also read the radiologist's report, which confirmed no acute findings.    PROCEDURES:  Critical Care performed: No  Procedures   MEDICATIONS ORDERED IN ED: Medications - No data to  display   IMPRESSION / MDM / [place] / ED COURSE  I reviewed the triage vital signs and the nursing notes.                              Differential diagnosis includes, but is not limited to, viral illness, hypertensive urgency/emergency, acute intra-abdominal infection such as diverticulitis, diverticulosis, hemorrhoid, AV malformation, neoplasm  Patient's presentation is most consistent with acute presentation with potential threat to life or bodily function.  Labs/studies ordered: Respiratory viral panel, CMP, CBC, two-view chest x-ray, EKG [facility] Course my include additional interventions not listed in this section:)  Patient's vital signs are generally reassuring; his blood pressure has almost completely normalized without intervention since coming here.  His lab results are all generally reassuring with a mild leukocytosis but no other significant abnormalities.  He has had no urinary symptoms and I do not feel strongly about checking a UA.  Most likely has a viral illness which is making him feel some general malaise.  By checking his blood pressure repeatedly he was becoming more concerned and was driving it up and out which increased his concern, increasing his blood pressure, etc.  He has no signs of CVA and does not need additional intervention.  No indication for additional testing or hospitalization.  I offered to perform a rectal exam but I explained that given his normal hemoglobin, no pain or tenderness to palpation the abdomen, etc., he would likely be appropriate for discharge and outpatient follow-up.  He would prefer not to have the rectal exam and to follow-up with GI.  I will send Dr. Vicente Males through [facility] and I am also providing the patient with follow-up information.  He can also follow-up with his primary care doctor.  I gave my usual and customary return precautions and he and his adult son who is at bedside understand and agree with the plan.    Clinical  Course as of 05/06/22 0438  Tue May 06, 2022  0301 Resp panel by RT-PCR (RSV, Flu A&B, Covid) Anterior Nasal Swab Negative panel  [CF]    Clinical Course User Index [CF] Hinda Kehr, MD     FINAL CLINICAL IMPRESSION(S) / ED DIAGNOSES   Final diagnoses:  Malaise  Hypertension, unspecified type  Rectal bleeding     Rx / DC Orders   ED Discharge Orders     None        Note:  This document was prepared using Dragon voice  recognition software and may include unintentional dictation errors.   Hinda Kehr, MD 05/06/22 [phone]

## 2022-05-06 NOTE — Telephone Encounter (Signed)
-----   Message from Jonathon Bellows, MD sent at 05/06/2022 10:32 AM EST ----- Regarding: RE: GI bleeding follow up Thanks Tommi Rumps   We will contact him and offer him an appointment   Regards  Kiran  ----- Message ----- From: Hinda Kehr, MD Sent: 05/06/2022   4:39 AM EST To: Jonathon Bellows, MD Subject: GI bleeding follow up                          [place]!  I have another rectal bleeding follow up for you.  This gentleman is 23, generally healthy and active for his age.  He said his last colonoscopy was about 8 years ago but he does not have an established ongoing relationship with a GI doctor.  He said that he has intermittent episodes of GI bleeding but he had a substantial amount earlier today.  It does not seem to still be going on, he has no abdominal pain or tenderness, and his hemoglobin was about 14.  He declined a rectal exam tonight after we talked about it but he would like to follow-up as an outpatient which seems appropriate.  If you could reach out to him, I would appreciate it.  Thanks!  I hope you and your family are well.    --Tommi Rumps

## 2022-05-06 NOTE — Telephone Encounter (Signed)
Called patient to schedule him an appointment and he agreed. Patient will come in on 05/19/2022 at 3:15 PM.

## 2022-05-06 NOTE — Discharge Instructions (Addendum)
As we discussed, though you do have high blood pressure (hypertension), fortunately it is not immediately dangerous at this time and does not need emergency intervention or admission to the hospital.  If we add to or change your regular medications, we may cause more harm than good - it is more appropriate for your primary care doctor to evaluate you in clinic and decide if any medication changes are needed.  Please follow up in clinic as recommended in these papers.    Additionally, we recommend that you follow-up with Dr. Vicente Males or one of his colleagues for further evaluation of your rectal bleeding.  We sent a message to him through the computer system and his staff should reach out to you to schedule an appointment, but if you have not heard from them by the end of this week, please call the number provided.  Explained that you were seen in the emergency department and that the ED physician recommend you follow-up with Dr. Vicente Males for rectal bleeding.  Return to the Emergency Department (ED) if you experience any worsening chest pain/pressure/tightness, difficulty breathing, or sudden sweating, or other symptoms that concern you.

## 2022-05-07 ENCOUNTER — Telehealth: Payer: Self-pay | Admitting: Internal Medicine

## 2022-05-07 MED ORDER — LISINOPRIL-HYDROCHLOROTHIAZIDE 20-12.5 MG PO TABS: 1.0000 | ORAL_TABLET | Freq: Every day | ORAL | 1 refills | Status: DC

## 2022-05-07 NOTE — Telephone Encounter (Signed)
*  STAT* If patient is at the pharmacy, call can be transferred to refill team.   1. Which medications need to be refilled? (please list name of each medication and dose if known)   lisinopril-hydrochlorothiazide (ZESTORETIC) 20-12.5 MG tablet    2. Which pharmacy/location (including street and city if local pharmacy) is medication to be sent to?  [id number] - [place], [place]    3. Do they need a 30 day or 90 day supply? 30 day

## 2022-05-07 NOTE — Telephone Encounter (Signed)
Requested Prescriptions   Signed Prescriptions Disp Refills   lisinopril-hydrochlorothiazide (ZESTORETIC) 20-12.5 MG tablet 30 tablet 1    Sig: Take 1 tablet by mouth daily.    Authorizing Provider: END, CHRISTOPHER    Ordering User: Raelene Bott, Skylarr Liz L

## 2022-05-08 ENCOUNTER — Other Ambulatory Visit: Payer: Self-pay

## 2022-05-08 MED ORDER — LISINOPRIL-HYDROCHLOROTHIAZIDE 20-12.5 MG PO TABS: 1.0000 | ORAL_TABLET | Freq: Every day | ORAL | 2 refills | Status: DC

## 2022-05-15 IMAGING — CT CT CARDIAC CORONARY ARTERY CALCIUM SCORE
2 series · 14 of 20 positions shown, 16 images · non-contrast
Comparison: None.

CLINICAL DATA: Elevated cholesterol, hypertension, former smoker

EXAM:
CT CARDIAC CORONARY ARTERY CALCIUM SCORE
TECHNIQUE: Non-contrast imaging through the heart was performed using
prospective ECG gating. Image post processing was performed on an
independent workstation, allowing for quantitative analysis of the
heart and coronary arteries. Note that this exam targets the heart
and the chest was not imaged in its entirety.

[Series 2: cascseq 2.0 d10f 70% · axial · 0.39mm/px · z∈[-134,+6]mm · 8 of 86 slices shown]
[im 8/86  vessel]
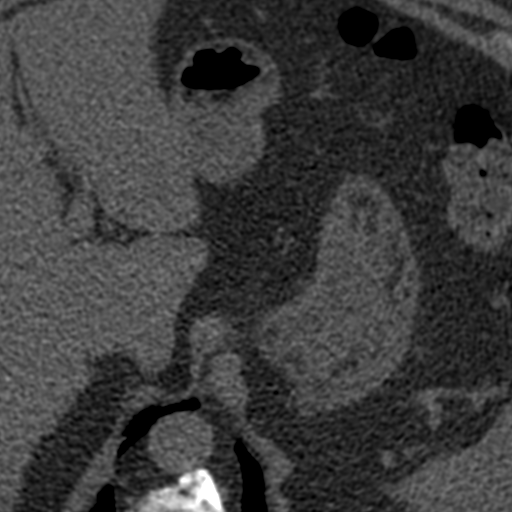
[im 16/86  vessel]
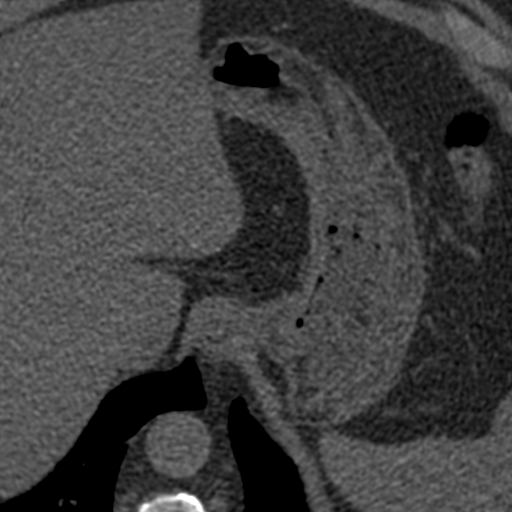
[im 31/86  vessel]
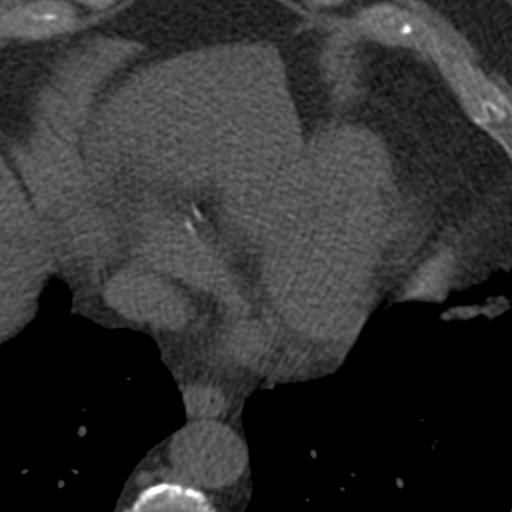
[im 39/86  vessel]
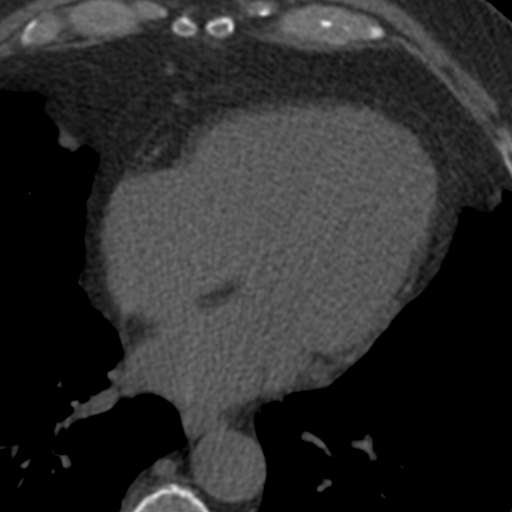
[im 47/86  vessel]
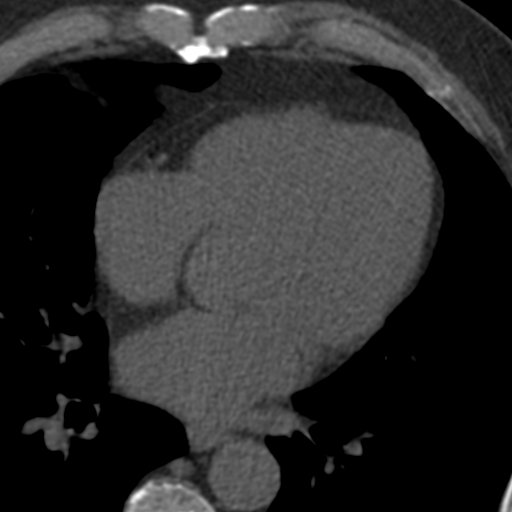
[im 55/86  vessel]
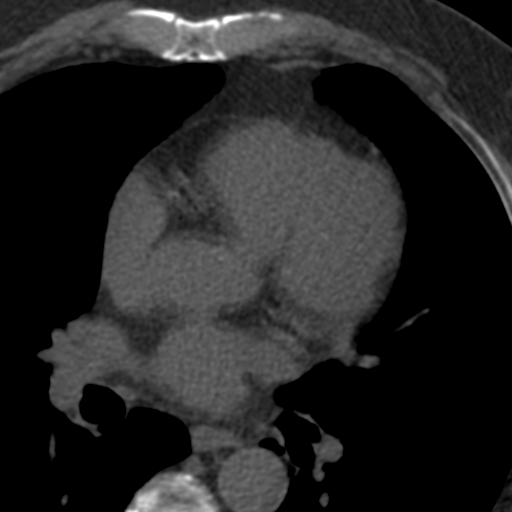
[im 70/86  vessel]
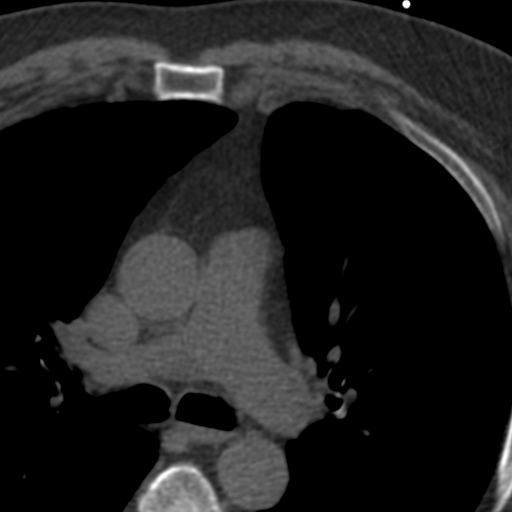
[im 78/86  vessel]
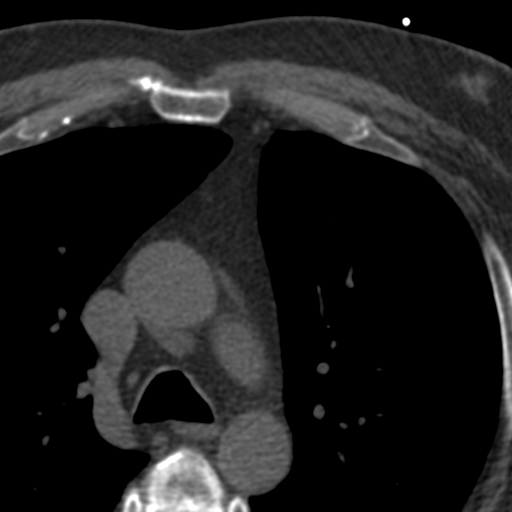

[Series 4: full fov chest · axial · 0.98mm/px · z∈[-123,-3]mm · 6 of 57 slices shown, 8 images]
[im 9/57  vessel]
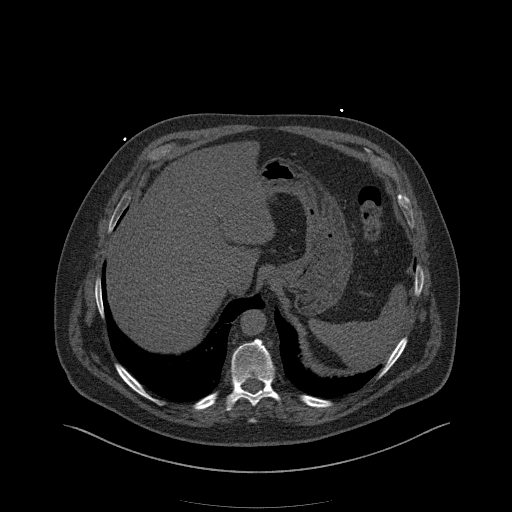
[im 9/57  lung]
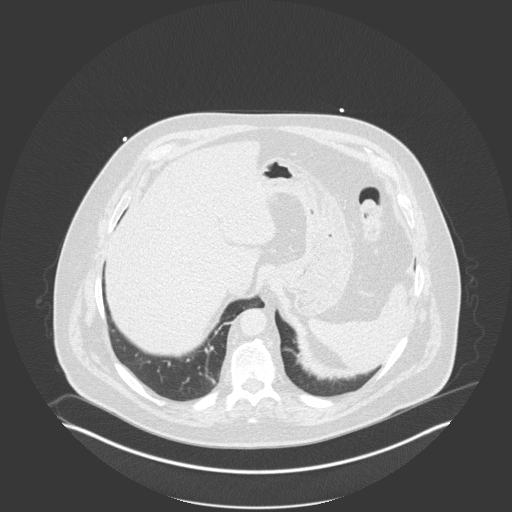
[im 17/57  vessel]
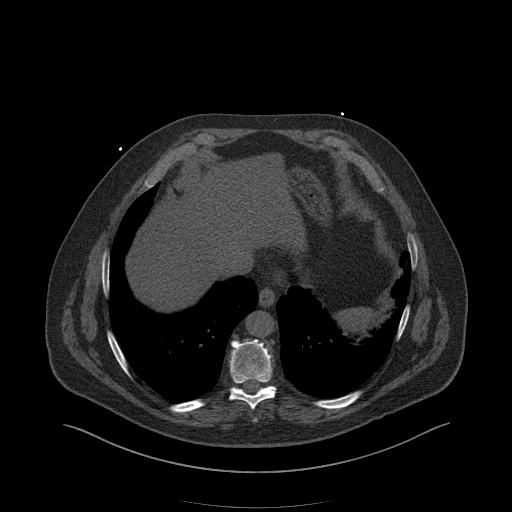
[im 25/57  vessel]
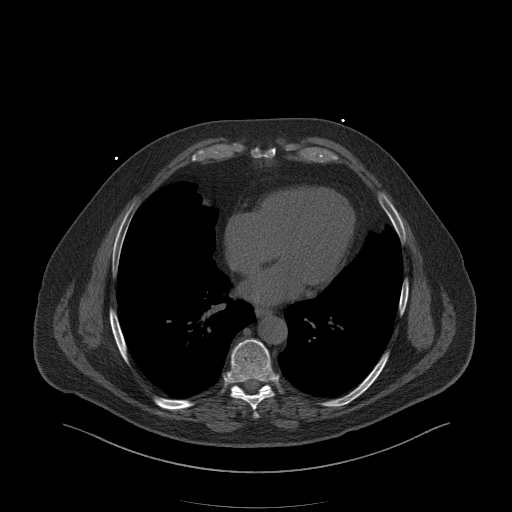
[im 33/57  vessel]
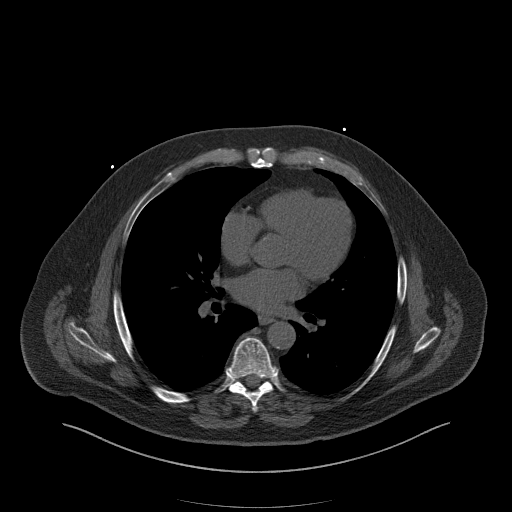
[im 41/57  vessel]
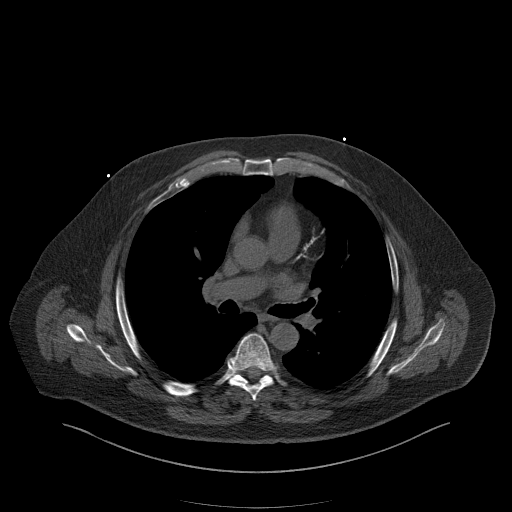
[im 41/57  lung]
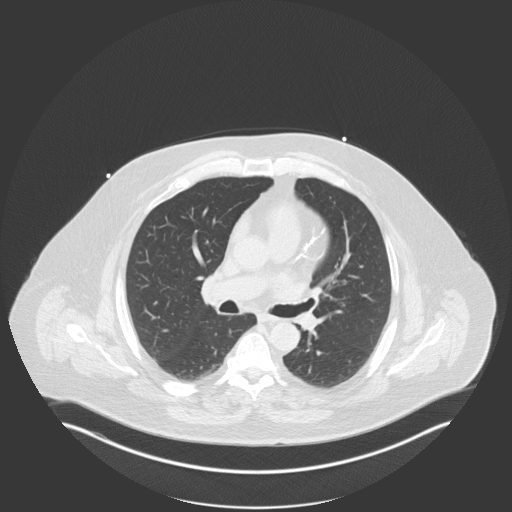
[im 49/57  vessel]
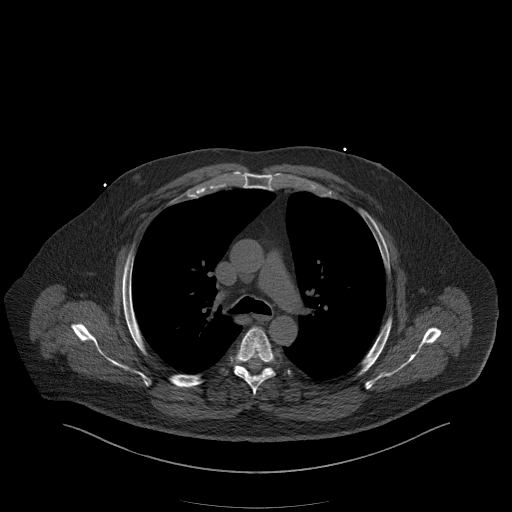

[14 of 20 positions shown; findings below may reference images not displayed]

FINDINGS: CORONARY CALCIUM SCORES:

Left Main: 0

LAD: 362

LCx:

RCA:

Total Agatston Score: 416

[HOSPITAL] percentile: 64

AORTA MEASUREMENTS:

Ascending Aorta: 31 mm

Descending Aorta: 28 mm

OTHER FINDINGS:

Heart is normal size. Aorta normal caliber. Scattered aortic
calcifications. No adenopathy. Linear scarring in the lung bases. No
confluent airspace opacities or effusions. No acute findings in the
upper abdomen. Chest wall soft tissues are unremarkable. No acute
bony abnormality.
IMPRESSION: Total Agatston score: 416

[HOSPITAL] percentile: 64

Scattered aortic atherosclerosis.

No acute extra cardiac abnormality.

## 2022-05-19 ENCOUNTER — Encounter: Payer: Self-pay | Admitting: Gastroenterology

## 2022-05-19 ENCOUNTER — Other Ambulatory Visit: Payer: Self-pay

## 2022-05-19 ENCOUNTER — Ambulatory Visit (INDEPENDENT_AMBULATORY_CARE_PROVIDER_SITE_OTHER): Payer: PPO | Admitting: Gastroenterology

## 2022-05-19 VITALS — BP 111/66 | HR 70 | Temp 98.3°F | Ht 67.0 in | Wt 229.8 lb

## 2022-05-19 DIAGNOSIS — K625 Hemorrhage of anus and rectum: Secondary | ICD-10-CM | POA: Diagnosis not present

## 2022-05-19 NOTE — Progress Notes (Signed)
Jonathon Bellows MD, MRCP(U.K) [address]  [place], [place] [address]  Main: [phone]  Fax: [phone]   Gastroenterology Consultation  Referring Provider:     ER Dr. Karma Greaser Primary Care Physician:  Baxter Hire, MD Primary Gastroenterologist:  Dr. Jonathon Bellows  Reason for Consultation:     Rectal bleed        HPI:   Phillip Lam is a 77 y.o. y/o male referred for consultation Dr. Karma Greaser for rectal bleeding.  He presented to the emergency room on 05/06/2022 and was found to have a significant amount of blood in the toilet after a bowel movement his hemoglobin was 14.1 g was discharged home with a plan to follow-up with me as an outpatient.  His last colonoscopy on epic was back in 2018 where no abnormalities were noted.  No mention about hemorrhoids.  But looking at the pictures it appears he probably did have internal hemorrhoids.  He says that the rectal bleeding has not recurred since but has occurred in the past.  Denies any change in bowel habits no family history of colon cancer.  No unintentional weight loss recently.  Past Medical History:  Diagnosis Date   Aortic atherosclerosis ([place])    Carpal tunnel syndrome    COPD (chronic obstructive pulmonary disease) ([place])    Coronary artery calcification    Hyperlipidemia    Hypertension    Impaired fasting glucose     Past Surgical History:  Procedure Laterality Date   CARPAL TUNNEL RELEASE Bilateral    COLONOSCOPY WITH PROPOFOL N/A 09/08/2016   Procedure: COLONOSCOPY WITH PROPOFOL;  Surgeon: Garlan Fair, MD;  Location: [facility];  Service: Endoscopy;  Laterality: N/A;   FRACTURE SURGERY     left elbow    Prior to Admission medications   Medication Sig Start Date End Date Taking? Authorizing Provider  acetaminophen (TYLENOL) 500 MG tablet as needed.    [provider]  albuterol (PROVENTIL HFA;VENTOLIN HFA) 108 (90 Base) MCG/ACT inhaler Inhale 2 puffs into the lungs every 4  (four) hours as needed for wheezing or shortness of breath.    [provider]  aspirin EC 81 MG tablet Take 81 mg by mouth daily.    [provider]  budesonide-formoterol (SYMBICORT) 80-4.5 MCG/ACT inhaler Inhale 2 puffs into the lungs 2 (two) times daily.    [provider]  celecoxib (CELEBREX) 100 MG capsule Take 100 mg by mouth 2 (two) times daily. 02/04/22   [provider]  lisinopril-hydrochlorothiazide (ZESTORETIC) 20-12.5 MG tablet Take 1 tablet by mouth daily. 05/08/22   End, Harrell Gave, MD  Multiple Vitamins-Minerals (MULTIVITAMIN WITH MINERALS) tablet Take 1 tablet by mouth daily.    [provider]  rosuvastatin (CRESTOR) 20 MG tablet Take 1 tablet (20 mg total) by mouth daily. 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   Physical Exam:  BP 111/66   Pulse 70   Temp 98.3 F (36.8 C) (Oral)   Ht '5\' 7"'$  (1.702 m)   Wt 229 lb 12.8 oz (104.2 kg)   BMI 35.99 kg/m  No LMP for male patient. Psych:  Alert and cooperative. Normal mood and affect. General:   Alert,  Well-developed, well-nourished, pleasant and cooperative in NAD Head:  Normocephalic and atraumatic. Eyes:  Sclera clear, no icterus.   Conjunctiva pink. Ears:  Normal auditory acuity.   Neurologic:  Alert  and oriented x3;  grossly normal neurologically. Psych:  Alert and cooperative. Normal mood and affect.  Imaging Studies: DG Chest 2 View  Result Date: 05/06/2022 CLINICAL DATA:  Evaluate for pneumonia. Multiple complaints. Not feeling well since this morning. Bright red blood in toilet. EXAM: CHEST - 2 VIEW COMPARISON:  None Available. FINDINGS: Normal heart size and pulmonary vascularity. Linear atelectasis or scarring in the lung bases. No focal airspace disease or consolidation in the lungs. No blunting of costophrenic angles. No pneumothorax. Mediastinal contours appear intact. Old healed fracture deformity of the left clavicle. Degenerative changes in the spine. IMPRESSION: 1. Linear atelectasis or scarring in the lung bases. 2. No evidence of active pulmonary disease. Electronically Signed   By: Lucienne Capers M.D.   On: 05/06/2022 01:22    Assessment and Plan:   Phillip Lam is a 77 y.o. y/o male has been referred for rectal bleeding.  Plan 1.  Diagnostic colonoscopy was recommended he said he will go home and think about it and once he makes up his mind he will call our office to have it scheduled.  We have provided him information about the same.  I explained that although it could be internal hemorrhoids we need to have a look with a colonoscopy to confirm there is nothing else such as a polyp or colon cancer.     Dr Jonathon Bellows MD,MRCP(U.K)  Follow-up as needed

## 2022-05-19 NOTE — Patient Instructions (Signed)
Please give [place] a call in case you would like to schedule a colonoscopy.  Take care.

## 2022-05-22 DIAGNOSIS — J449 Chronic obstructive pulmonary disease, unspecified: Secondary | ICD-10-CM | POA: Diagnosis not present

## 2022-05-22 DIAGNOSIS — E7800 Pure hypercholesterolemia, unspecified: Secondary | ICD-10-CM | POA: Diagnosis not present

## 2022-05-22 DIAGNOSIS — Z125 Encounter for screening for malignant neoplasm of prostate: Secondary | ICD-10-CM | POA: Diagnosis not present

## 2022-05-22 DIAGNOSIS — I2510 Atherosclerotic heart disease of native coronary artery without angina pectoris: Secondary | ICD-10-CM | POA: Diagnosis not present

## 2022-05-22 DIAGNOSIS — I872 Venous insufficiency (chronic) (peripheral): Secondary | ICD-10-CM | POA: Diagnosis not present

## 2022-05-22 DIAGNOSIS — I10 Essential (primary) hypertension: Secondary | ICD-10-CM | POA: Diagnosis not present

## 2022-05-22 DIAGNOSIS — I700 Atherosclerosis of aorta: Secondary | ICD-10-CM | POA: Diagnosis not present

## 2022-07-02 ENCOUNTER — Ambulatory Visit: Payer: PPO | Attending: Internal Medicine | Admitting: Internal Medicine

## 2022-07-02 ENCOUNTER — Encounter: Payer: Self-pay | Admitting: Internal Medicine

## 2022-07-02 VITALS — BP 120/72 | HR 61 | Ht 67.0 in | Wt 233.1 lb

## 2022-07-02 DIAGNOSIS — E785 Hyperlipidemia, unspecified: Secondary | ICD-10-CM | POA: Diagnosis not present

## 2022-07-02 DIAGNOSIS — I2510 Atherosclerotic heart disease of native coronary artery without angina pectoris: Secondary | ICD-10-CM

## 2022-07-02 DIAGNOSIS — I10 Essential (primary) hypertension: Secondary | ICD-10-CM | POA: Diagnosis not present

## 2022-07-02 DIAGNOSIS — R0609 Other forms of dyspnea: Secondary | ICD-10-CM

## 2022-07-02 NOTE — Progress Notes (Signed)
Follow-up Outpatient Visit Date: 07/02/2022  Primary Care Provider: Gracelyn Nurse, MD [address] [place] [address]  Chief Complaint: Follow-up coronary artery disease  HPI:  Phillip Lam is a 77 y.o. male with history of coronary artery calcification, hypertension, hyperlipidemia, impaired glucose tolerance, COPD, and obesity, who presents for follow-up of coronary artery disease.  I last saw him in 09/2021, at which time he was feeling fairly well though he noted a couple of episodes over the last few years during which he "did not feel quite right."  He noted some chronic exertional dyspnea as well, which was thought to be due to his underlying COPD given reassuring MPI last May.  We discussed performing an echocardiogram, though we agreed to defer this.  Today, Phillip Lam reports that he has been feeling fairly well.  He notes an episode of "not feeling well" associated with spikes in his blood pressure that led to ED visit in February.  Workup was unrevealing (it does not appear that cardiac biomarkers were checked).  BP was only mildly elevated in the ED.  Since then, he has been feeling okay, though he notes chronic exertional dyspnea.  His activity is also limited by pain in both knees.  He spends most of his time at home, caring for his wire.  He notes occasional transient flutters in the chest without associated symptoms as well as sporadic "gas pain" in his chest that usually lasts only a few seconds.  He denies exertional chest pain.  DOE is stable compared to 6 months ago but is worse than 3 years ago.  He attributes this to his COPD.  He is trying to follow a healthy diet but does not exercise very much.  --------------------------------------------------------------------------------------------------  Cardiovascular History & Procedures: Cardiovascular Problems: Dyspnea on exertion Coronary artery calcification Aortic atherosclerosis   Risk Factors: Hypertension,  hyperlipidemia, male gender, prior tobacco use, obesity, family history, and age greater than 83   Cath/PCI: None   CV Surgery: None   EP Procedures and Devices: None   Non-Invasive Evaluation(s): Pharmacologic MPI (08/05/2021): Low risk study without evidence of ischemia or scar.  LVEF greater than 65%. Coronary calcium score (05/31/2021): Three-vessel coronary artery calcium, most pronounced in the LAD.  Coronary calcium score 416 (64th percentile).  Aortic atherosclerosis noted.  Recent CV Pertinent Labs: Lab Results  Component Value Date   K 3.7 05/05/2022   BUN 23 05/05/2022   CREATININE 1.04 05/05/2022    Past medical and surgical history were reviewed and updated in EPIC.  Current Meds  Medication Sig   acetaminophen (TYLENOL) 500 MG tablet as needed.   albuterol (PROVENTIL HFA;VENTOLIN HFA) 108 (90 Base) MCG/ACT inhaler Inhale 2 puffs into the lungs every 4 (four) hours as needed for wheezing or shortness of breath.   aspirin EC 81 MG tablet Take 81 mg by mouth daily.   budesonide-formoterol (SYMBICORT) 80-4.5 MCG/ACT inhaler Inhale 2 puffs into the lungs 2 (two) times daily.   lisinopril-hydrochlorothiazide (ZESTORETIC) 20-12.5 MG tablet Take 1 tablet by mouth daily.   Multiple Vitamins-Minerals (MULTIVITAMIN WITH MINERALS) tablet Take 1 tablet by mouth daily.   rosuvastatin (CRESTOR) 20 MG tablet Take 1 tablet (20 mg total) by mouth daily.    Allergies: Peanut oil  Social History   Tobacco Use   Smoking status: Former    Types: Cigars    Quit date: 2019    Years since quitting: 5.2   Smokeless tobacco: Never  Vaping Use   Vaping  Use: Never used  Substance Use Topics   Alcohol use: Not Currently   Drug use: Not Currently    Family History  Problem Relation Age of Onset   Stroke Mother 24   Stroke Father 25   Heart disease Father    Heart attack Brother    Coronary artery disease Brother 53   Heart attack Son 24    Review of Systems: A 12-system  review of systems was performed and was negative except as noted in the HPI.  --------------------------------------------------------------------------------------------------  Physical Exam: BP 120/72 (BP Location: Left Arm, Patient Position: Sitting, Cuff Size: Normal)   Pulse 61   Ht 5\' 7"  (1.702 m)   Wt 233 lb 2 oz (105.7 kg)   SpO2 95%   BMI 36.51 kg/m   General:  NAD. Neck: No JVD or HJR. Lungs: Clear to auscultation bilaterally without wheezes or crackles. Heart: Regular rate and rhythm without murmurs, rubs, or gallops. Abdomen: Soft, nontender, nondistended. Extremities: No lower extremity edema.  EKG: Normal sinus rhythm without abnormalities.  Lab Results  Component Value Date   WBC 12.2 (H) 05/05/2022   HGB 14.1 05/05/2022   HCT 42.6 05/05/2022   MCV 90.4 05/05/2022   PLT 266 05/05/2022    Lab Results  Component Value Date   NA 136 05/05/2022   K 3.7 05/05/2022   CL 101 05/05/2022   CO2 23 05/05/2022   BUN 23 05/05/2022   CREATININE 1.04 05/05/2022   GLUCOSE 119 (H) 05/05/2022   ALT 39 05/05/2022    No results found for: "CHOL", "HDL", "LDLCALC", "LDLDIRECT", "TRIG", "CHOLHDL"  --------------------------------------------------------------------------------------------------  ASSESSMENT AND PLAN: Coronary artery disease without angina and hyperlipidemia: Phillip Lam continues to do well.  His sporadic gas-like pains in the chest or not consistent with angina.  We discussed continued medical therapy to prevent progression of disease.  We will request his most recent lipid panel from Dr. Jone Baseman office.  Phillip Lam is scheduled to establish with Dr. Letitia Libra this summer and will have repeat labs drawn at that time.  In the meantime, we will continue aspirin and rosuvastatin.  Hypertension: Blood pressure well-controlled today.  No medication changes at this time.  Dyspnea on exertion: This has been a chronic issue for Phillip Lam but seems to have  worsened over the last 3 years.  He appears euvolemic on exam.  He attributes his dyspnea to COPD, though he has never had an echocardiogram to exclude underlying structural heart disease.  Of note, LVEF was normal on MPI last year.  We have agreed to obtain an echo for further evaluation.  Follow-up: Return to clinic in 1 year.  Yvonne Kendall, MD 07/02/2022 8:20 AM

## 2022-07-02 NOTE — Patient Instructions (Signed)
Medication Instructions:  Your Physician recommend you continue on your current medication as directed.    *If you need a refill on your cardiac medications before your next appointment, please call your pharmacy*   Lab Work: None ordered today   Testing/Procedures: Your physician has requested that you have an echocardiogram. Echocardiography is a painless test that uses sound waves to create images of your heart. It provides your doctor with information about the size and shape of your heart and how well your heart's chambers and valves are working.   You may receive an ultrasound enhancing agent through an IV if needed to better visualize your heart during the echo. This procedure takes approximately one hour.  There are no restrictions for this procedure.  This will take place at [address] ([facility] [address], [place] [address]    Follow-Up: At [facility], you and your health needs are our priority.  As part of our continuing mission to provide you with exceptional heart care, we have created designated Provider Care Teams.  These Care Teams include your primary Cardiologist (physician) and Advanced Practice Providers (APPs -  Physician Assistants and Nurse Practitioners) who all work together to provide you with the care you need, when you need it.  We recommend signing up for the patient portal called "MyChart".  Sign up information is provided on this After Visit Summary.  MyChart is used to connect with patients for Virtual Visits (Telemedicine).  Patients are able to view lab/test results, encounter notes, upcoming appointments, etc.  Non-urgent messages can be sent to your provider as well.   To learn more about what you can do with MyChart, go to [email or web address].com.    Your next appointment:   1 year(s)  Provider:   You may see Christopher End, MD or one of the following Advanced Practice Providers on your designated Care Team:   Christopher  Berge, NP Ryan Dunn, PA-C Cadence Furth, PA-C Sheri Hammock, NP      

## 2022-07-03 ENCOUNTER — Encounter: Payer: Self-pay | Admitting: *Deleted

## 2022-07-03 DIAGNOSIS — Z006 Encounter for examination for normal comparison and control in clinical research program: Secondary | ICD-10-CM

## 2022-07-03 NOTE — Research (Signed)
Spoke with Mr Heffington about V1p research. He  states that he would like to read over the information. Emailed him a copy of the consent to read over.

## 2022-07-09 ENCOUNTER — Telehealth: Payer: Self-pay

## 2022-07-09 ENCOUNTER — Ambulatory Visit: Payer: No Typology Code available for payment source | Admitting: Internal Medicine

## 2022-07-09 ENCOUNTER — Telehealth: Payer: Self-pay | Admitting: Internal Medicine

## 2022-07-09 NOTE — Telephone Encounter (Signed)
Phillip Lam is returning [place] call, and is requesting a callback. Please advise.

## 2022-07-09 NOTE — Telephone Encounter (Signed)
Spoke with Dr. Jone Baseman office. Office staff stated pt last lab was completed on 07/17/21. Nurse also spoke with Clydie Braun from Dr. Letitia Libra office who stated pt will have lab drawn in August.   Will forward to Dr. Okey Dupre to make aware

## 2022-07-09 NOTE — Telephone Encounter (Signed)
Recent lab work from [facility] requested for Dr. Okey Dupre to review.

## 2022-07-13 NOTE — Telephone Encounter (Signed)
Thank you for update.  We will await results of labs when Phillip Lam establishes with Dr. Letitia Libra.  Yvonne Kendall, MD  [facility]

## 2022-08-04 ENCOUNTER — Ambulatory Visit: Payer: PPO | Attending: Internal Medicine

## 2022-08-04 DIAGNOSIS — R0609 Other forms of dyspnea: Secondary | ICD-10-CM | POA: Diagnosis not present

## 2022-08-04 LAB — ECHOCARDIOGRAM COMPLETE
AR max vel: 1.53 cm2
AV Area VTI: 1.57 cm2
AV Area mean vel: 1.46 cm2
AV Mean grad: 4.5 mmHg
AV Peak grad: 8.2 mmHg
Ao pk vel: 1.44 m/s
Area-P 1/2: 3.08 cm2
Calc EF: 67 %
S' Lateral: 2.6 cm
Single Plane A2C EF: 67.3 %
Single Plane A4C EF: 64.8 %

## 2022-08-04 MED ORDER — PERFLUTREN LIPID MICROSPHERE
1.0000 mL | INTRAVENOUS | Status: AC | PRN
  Administered 2022-08-04: 2 mL via INTRAVENOUS

## 2022-08-06 ENCOUNTER — Encounter: Payer: Self-pay | Admitting: Internal Medicine

## 2022-08-06 NOTE — Telephone Encounter (Signed)
Error

## 2022-09-03 ENCOUNTER — Other Ambulatory Visit: Payer: Self-pay | Admitting: Internal Medicine

## 2022-11-19 DIAGNOSIS — Z125 Encounter for screening for malignant neoplasm of prostate: Secondary | ICD-10-CM | POA: Diagnosis not present

## 2022-11-19 DIAGNOSIS — I10 Essential (primary) hypertension: Secondary | ICD-10-CM | POA: Diagnosis not present

## 2022-11-26 DIAGNOSIS — E785 Hyperlipidemia, unspecified: Secondary | ICD-10-CM | POA: Diagnosis not present

## 2022-11-26 DIAGNOSIS — Z0001 Encounter for general adult medical examination with abnormal findings: Secondary | ICD-10-CM | POA: Diagnosis not present

## 2022-11-26 DIAGNOSIS — Z0000 Encounter for general adult medical examination without abnormal findings: Secondary | ICD-10-CM | POA: Diagnosis not present

## 2022-11-26 DIAGNOSIS — I10 Essential (primary) hypertension: Secondary | ICD-10-CM | POA: Diagnosis not present

## 2022-11-26 DIAGNOSIS — I2510 Atherosclerotic heart disease of native coronary artery without angina pectoris: Secondary | ICD-10-CM | POA: Diagnosis not present

## 2022-11-26 DIAGNOSIS — L989 Disorder of the skin and subcutaneous tissue, unspecified: Secondary | ICD-10-CM | POA: Diagnosis not present

## 2022-11-26 DIAGNOSIS — J449 Chronic obstructive pulmonary disease, unspecified: Secondary | ICD-10-CM | POA: Diagnosis not present

## 2023-03-06 ENCOUNTER — Other Ambulatory Visit: Payer: Self-pay

## 2023-03-06 MED ORDER — FLUTICASONE-SALMETEROL 500-50 MCG/ACT IN AEPB
1.0000 | INHALATION_SPRAY | Freq: Two times a day (BID) | RESPIRATORY_TRACT | 8 refills | Status: DC
  Filled 2023-04-27: qty 60, 30d supply, fill #0
  Filled 2023-05-26: qty 60, 30d supply, fill #1
  Filled 2023-06-26: qty 60, 30d supply, fill #2
  Filled 2023-07-27: qty 60, 30d supply, fill #3
  Filled 2023-08-24: qty 60, 30d supply, fill #4
  Filled 2023-09-29: qty 60, 30d supply, fill #5
  Filled 2023-10-26: qty 60, 30d supply, fill #6

## 2023-03-17 ENCOUNTER — Other Ambulatory Visit: Payer: Self-pay

## 2023-03-27 ENCOUNTER — Other Ambulatory Visit: Payer: Self-pay

## 2023-03-27 MED FILL — Lisinopril & Hydrochlorothiazide Tab 20-12.5 MG: ORAL | 30 days supply | Qty: 30 | Fill #0 | Status: AC

## 2023-04-20 DIAGNOSIS — L0102 Bockhart's impetigo: Secondary | ICD-10-CM | POA: Diagnosis not present

## 2023-04-20 DIAGNOSIS — L570 Actinic keratosis: Secondary | ICD-10-CM | POA: Diagnosis not present

## 2023-04-20 DIAGNOSIS — C44519 Basal cell carcinoma of skin of other part of trunk: Secondary | ICD-10-CM | POA: Diagnosis not present

## 2023-04-20 DIAGNOSIS — C44319 Basal cell carcinoma of skin of other parts of face: Secondary | ICD-10-CM | POA: Diagnosis not present

## 2023-04-20 DIAGNOSIS — L905 Scar conditions and fibrosis of skin: Secondary | ICD-10-CM | POA: Diagnosis not present

## 2023-04-20 DIAGNOSIS — D485 Neoplasm of uncertain behavior of skin: Secondary | ICD-10-CM | POA: Diagnosis not present

## 2023-04-20 DIAGNOSIS — L738 Other specified follicular disorders: Secondary | ICD-10-CM | POA: Diagnosis not present

## 2023-04-20 DIAGNOSIS — L821 Other seborrheic keratosis: Secondary | ICD-10-CM | POA: Diagnosis not present

## 2023-04-20 DIAGNOSIS — D2262 Melanocytic nevi of left upper limb, including shoulder: Secondary | ICD-10-CM | POA: Diagnosis not present

## 2023-04-20 DIAGNOSIS — D225 Melanocytic nevi of trunk: Secondary | ICD-10-CM | POA: Diagnosis not present

## 2023-04-20 DIAGNOSIS — C44311 Basal cell carcinoma of skin of nose: Secondary | ICD-10-CM | POA: Diagnosis not present

## 2023-04-20 DIAGNOSIS — D2271 Melanocytic nevi of right lower limb, including hip: Secondary | ICD-10-CM | POA: Diagnosis not present

## 2023-04-20 DIAGNOSIS — L820 Inflamed seborrheic keratosis: Secondary | ICD-10-CM | POA: Diagnosis not present

## 2023-04-20 DIAGNOSIS — D2261 Melanocytic nevi of right upper limb, including shoulder: Secondary | ICD-10-CM | POA: Diagnosis not present

## 2023-04-20 DIAGNOSIS — C4401 Basal cell carcinoma of skin of lip: Secondary | ICD-10-CM | POA: Diagnosis not present

## 2023-04-20 DIAGNOSIS — D2272 Melanocytic nevi of left lower limb, including hip: Secondary | ICD-10-CM | POA: Diagnosis not present

## 2023-04-20 DIAGNOSIS — C4362 Malignant melanoma of left upper limb, including shoulder: Secondary | ICD-10-CM | POA: Diagnosis not present

## 2023-04-27 ENCOUNTER — Other Ambulatory Visit: Payer: Self-pay

## 2023-05-01 ENCOUNTER — Other Ambulatory Visit: Payer: Self-pay

## 2023-05-01 DIAGNOSIS — J449 Chronic obstructive pulmonary disease, unspecified: Secondary | ICD-10-CM | POA: Diagnosis not present

## 2023-05-01 DIAGNOSIS — I10 Essential (primary) hypertension: Secondary | ICD-10-CM | POA: Diagnosis not present

## 2023-05-01 DIAGNOSIS — E785 Hyperlipidemia, unspecified: Secondary | ICD-10-CM | POA: Diagnosis not present

## 2023-05-01 DIAGNOSIS — I2510 Atherosclerotic heart disease of native coronary artery without angina pectoris: Secondary | ICD-10-CM | POA: Diagnosis not present

## 2023-05-01 DIAGNOSIS — Z125 Encounter for screening for malignant neoplasm of prostate: Secondary | ICD-10-CM | POA: Diagnosis not present

## 2023-05-01 DIAGNOSIS — E669 Obesity, unspecified: Secondary | ICD-10-CM | POA: Diagnosis not present

## 2023-05-01 MED ORDER — WEGOVY 0.25 MG/0.5ML ~~LOC~~ SOAJ: 0.5000 mL | SUBCUTANEOUS | 5 refills | Status: DC

## 2023-05-11 MED FILL — Lisinopril & Hydrochlorothiazide Tab 20-12.5 MG: ORAL | 30 days supply | Qty: 30 | Fill #1 | Status: AC

## 2023-05-19 ENCOUNTER — Other Ambulatory Visit: Payer: Self-pay

## 2023-05-20 ENCOUNTER — Other Ambulatory Visit: Payer: Self-pay

## 2023-05-22 ENCOUNTER — Other Ambulatory Visit: Payer: Self-pay

## 2023-05-25 ENCOUNTER — Other Ambulatory Visit: Payer: Self-pay

## 2023-05-25 MED ORDER — ROSUVASTATIN CALCIUM 20 MG PO TABS
20.0000 mg | ORAL_TABLET | Freq: Every day | ORAL | 0 refills | Status: DC
  Filled 2023-05-25: qty 90, 90d supply, fill #0

## 2023-05-26 ENCOUNTER — Other Ambulatory Visit: Payer: Self-pay

## 2023-05-26 MED ORDER — OZEMPIC (0.25 OR 0.5 MG/DOSE) 2 MG/3ML ~~LOC~~ SOPN
0.2500 mg | PEN_INJECTOR | SUBCUTANEOUS | 8 refills | Status: DC
Start: 2023-05-26 — End: 2023-11-25
  Filled 2023-05-26 – 2023-05-28 (×3): qty 3, 28d supply, fill #0

## 2023-05-27 ENCOUNTER — Other Ambulatory Visit: Payer: Self-pay

## 2023-05-28 ENCOUNTER — Other Ambulatory Visit: Payer: Self-pay

## 2023-06-01 DIAGNOSIS — C4362 Malignant melanoma of left upper limb, including shoulder: Secondary | ICD-10-CM | POA: Diagnosis not present

## 2023-06-01 DIAGNOSIS — D0362 Melanoma in situ of left upper limb, including shoulder: Secondary | ICD-10-CM | POA: Diagnosis not present

## 2023-06-04 MED FILL — Lisinopril & Hydrochlorothiazide Tab 20-12.5 MG: ORAL | 30 days supply | Qty: 30 | Fill #2 | Status: AC

## 2023-06-08 ENCOUNTER — Other Ambulatory Visit: Payer: Self-pay

## 2023-06-08 DIAGNOSIS — Z48817 Encounter for surgical aftercare following surgery on the skin and subcutaneous tissue: Secondary | ICD-10-CM | POA: Diagnosis not present

## 2023-06-08 MED ORDER — CEPHALEXIN 500 MG PO CAPS
500.0000 mg | ORAL_CAPSULE | Freq: Four times a day (QID) | ORAL | 0 refills | Status: DC
  Filled 2023-06-08: qty 40, 10d supply, fill #0

## 2023-06-11 ENCOUNTER — Other Ambulatory Visit: Payer: Self-pay

## 2023-06-11 MED ORDER — MUPIROCIN 2 % EX OINT
TOPICAL_OINTMENT | Freq: Two times a day (BID) | CUTANEOUS | 1 refills | Status: DC
Start: 2023-06-11 — End: 2023-11-25
  Filled 2023-06-11: qty 22, 7d supply, fill #0

## 2023-06-15 DIAGNOSIS — C44519 Basal cell carcinoma of skin of other part of trunk: Secondary | ICD-10-CM | POA: Diagnosis not present

## 2023-06-22 ENCOUNTER — Other Ambulatory Visit: Payer: Self-pay

## 2023-06-22 MED ORDER — ALBUTEROL SULFATE HFA 108 (90 BASE) MCG/ACT IN AERS
2.0000 | INHALATION_SPRAY | Freq: Four times a day (QID) | RESPIRATORY_TRACT | 0 refills | Status: DC | PRN
Start: 2023-06-22 — End: 2023-08-28
  Filled 2023-06-22: qty 8.5, 25d supply, fill #0

## 2023-06-26 ENCOUNTER — Other Ambulatory Visit: Payer: Self-pay

## 2023-07-03 ENCOUNTER — Other Ambulatory Visit: Payer: Self-pay

## 2023-07-06 ENCOUNTER — Other Ambulatory Visit: Payer: Self-pay

## 2023-07-06 MED FILL — Lisinopril & Hydrochlorothiazide Tab 20-12.5 MG: ORAL | 30 days supply | Qty: 30 | Fill #3 | Status: AC

## 2023-07-21 ENCOUNTER — Other Ambulatory Visit: Payer: Self-pay

## 2023-07-21 DIAGNOSIS — M5489 Other dorsalgia: Secondary | ICD-10-CM | POA: Diagnosis not present

## 2023-07-21 MED ORDER — METHOCARBAMOL 500 MG PO TABS
500.0000 mg | ORAL_TABLET | Freq: Four times a day (QID) | ORAL | 1 refills | Status: DC
  Filled 2023-07-21: qty 40, 10d supply, fill #0

## 2023-07-27 ENCOUNTER — Other Ambulatory Visit: Payer: Self-pay

## 2023-08-03 ENCOUNTER — Other Ambulatory Visit: Payer: Self-pay

## 2023-08-03 MED ORDER — LISINOPRIL-HYDROCHLOROTHIAZIDE 20-12.5 MG PO TABS
1.0000 | ORAL_TABLET | Freq: Every day | ORAL | 3 refills | Status: DC
  Filled 2023-08-03: qty 30, 30d supply, fill #0

## 2023-08-18 DIAGNOSIS — L578 Other skin changes due to chronic exposure to nonionizing radiation: Secondary | ICD-10-CM | POA: Diagnosis not present

## 2023-08-18 DIAGNOSIS — C4401 Basal cell carcinoma of skin of lip: Secondary | ICD-10-CM | POA: Diagnosis not present

## 2023-08-18 DIAGNOSIS — L988 Other specified disorders of the skin and subcutaneous tissue: Secondary | ICD-10-CM | POA: Diagnosis not present

## 2023-08-18 DIAGNOSIS — L814 Other melanin hyperpigmentation: Secondary | ICD-10-CM | POA: Diagnosis not present

## 2023-08-18 DIAGNOSIS — C44319 Basal cell carcinoma of skin of other parts of face: Secondary | ICD-10-CM | POA: Diagnosis not present

## 2023-08-18 DIAGNOSIS — C44311 Basal cell carcinoma of skin of nose: Secondary | ICD-10-CM | POA: Diagnosis not present

## 2023-08-19 ENCOUNTER — Other Ambulatory Visit: Payer: Self-pay

## 2023-08-19 MED ORDER — ROSUVASTATIN CALCIUM 20 MG PO TABS
20.0000 mg | ORAL_TABLET | Freq: Every day | ORAL | 0 refills | Status: DC
  Filled 2023-08-19: qty 90, 90d supply, fill #0

## 2023-08-22 ENCOUNTER — Other Ambulatory Visit: Payer: Self-pay

## 2023-08-22 DIAGNOSIS — J449 Chronic obstructive pulmonary disease, unspecified: Secondary | ICD-10-CM | POA: Insufficient documentation

## 2023-08-22 DIAGNOSIS — D72829 Elevated white blood cell count, unspecified: Secondary | ICD-10-CM | POA: Insufficient documentation

## 2023-08-22 DIAGNOSIS — I10 Essential (primary) hypertension: Secondary | ICD-10-CM | POA: Insufficient documentation

## 2023-08-22 DIAGNOSIS — L0390 Cellulitis, unspecified: Secondary | ICD-10-CM | POA: Diagnosis not present

## 2023-08-22 DIAGNOSIS — R220 Localized swelling, mass and lump, head: Secondary | ICD-10-CM | POA: Diagnosis not present

## 2023-08-22 DIAGNOSIS — Z79899 Other long term (current) drug therapy: Secondary | ICD-10-CM | POA: Insufficient documentation

## 2023-08-22 DIAGNOSIS — Z7982 Long term (current) use of aspirin: Secondary | ICD-10-CM | POA: Diagnosis not present

## 2023-08-22 DIAGNOSIS — L03213 Periorbital cellulitis: Secondary | ICD-10-CM | POA: Diagnosis not present

## 2023-08-22 LAB — CBC WITH DIFFERENTIAL/PLATELET
Abs Immature Granulocytes: 0.07 10*3/uL (ref 0.00–0.07)
Basophils Absolute: 0.1 10*3/uL (ref 0.0–0.1)
Basophils Relative: 1 %
Eosinophils Absolute: 0.5 10*3/uL (ref 0.0–0.5)
Eosinophils Relative: 4 %
HCT: 41.2 % (ref 39.0–52.0)
Hemoglobin: 13.3 g/dL (ref 13.0–17.0)
Immature Granulocytes: 1 %
Lymphocytes Relative: 25 %
Lymphs Abs: 3.5 10*3/uL (ref 0.7–4.0)
MCH: 29.8 pg (ref 26.0–34.0)
MCHC: 32.3 g/dL (ref 30.0–36.0)
MCV: 92.4 fL (ref 80.0–100.0)
Monocytes Absolute: 1.3 10*3/uL — ABNORMAL HIGH (ref 0.1–1.0)
Monocytes Relative: 9 %
Neutro Abs: 8.6 10*3/uL — ABNORMAL HIGH (ref 1.7–7.7)
Neutrophils Relative %: 60 %
Platelets: 277 10*3/uL (ref 150–400)
RBC: 4.46 MIL/uL (ref 4.22–5.81)
RDW: 13.6 % (ref 11.5–15.5)
WBC: 14 10*3/uL — ABNORMAL HIGH (ref 4.0–10.5)
nRBC: 0 % (ref 0.0–0.2)

## 2023-08-22 LAB — BASIC METABOLIC PANEL WITH GFR
Anion gap: 10 (ref 5–15)
BUN: 40 mg/dL — ABNORMAL HIGH (ref 8–23)
CO2: 23 mmol/L (ref 22–32)
Calcium: 9.2 mg/dL (ref 8.9–10.3)
Chloride: 103 mmol/L (ref 98–111)
Creatinine, Ser: 1.29 mg/dL — ABNORMAL HIGH (ref 0.61–1.24)
GFR, Estimated: 58 mL/min — ABNORMAL LOW (ref >60)
Glucose, Bld: 136 mg/dL — ABNORMAL HIGH (ref 70–99)
Potassium: 3.8 mmol/L (ref 3.5–5.1)
Sodium: 136 mmol/L (ref 135–145)

## 2023-08-22 NOTE — ED Triage Notes (Signed)
 Pt presents to ER from home with complaints of right side face swelling, discharge, and pain. Pt reports he had Mohs surgery last Tuesday at [facility]. Pt noticed more discharge from incision area and redness and swelling spreading to right eye. Pt talks in complete sentences no respiratory distress noted

## 2023-08-23 ENCOUNTER — Emergency Department

## 2023-08-23 ENCOUNTER — Emergency Department
Admission: EM | Admit: 2023-08-23 | Discharge: 2023-08-23 | Disposition: A | Discharge status: Home / Self Care | Attending: Emergency Medicine | Admitting: Emergency Medicine

## 2023-08-23 DIAGNOSIS — L0390 Cellulitis, unspecified: Secondary | ICD-10-CM | POA: Diagnosis not present

## 2023-08-23 DIAGNOSIS — C439 Malignant melanoma of skin, unspecified: Secondary | ICD-10-CM

## 2023-08-23 DIAGNOSIS — L03213 Periorbital cellulitis: Secondary | ICD-10-CM

## 2023-08-23 DIAGNOSIS — R220 Localized swelling, mass and lump, head: Secondary | ICD-10-CM | POA: Diagnosis not present

## 2023-08-23 HISTORY — DX: Malignant melanoma of skin, unspecified: C43.9

## 2023-08-23 MED ORDER — SODIUM CHLORIDE 0.9 % IV BOLUS (SEPSIS)
500.0000 mL | Freq: Once | INTRAVENOUS | Status: AC
  Administered 2023-08-23: 500 mL via INTRAVENOUS

## 2023-08-23 MED ORDER — DOXYCYCLINE HYCLATE 100 MG PO TABS: 100.0000 mg | ORAL_TABLET | Freq: Two times a day (BID) | ORAL | 0 refills | Status: DC

## 2023-08-23 MED ORDER — IOHEXOL 300 MG/ML  SOLN
75.0000 mL | Freq: Once | INTRAMUSCULAR | Status: AC | PRN
  Administered 2023-08-23: 75 mL via INTRAVENOUS

## 2023-08-23 MED ORDER — VANCOMYCIN HCL 2000 MG/400ML IV SOLN
2000.0000 mg | Freq: Once | INTRAVENOUS | Status: AC
  Administered 2023-08-23: 2000 mg via INTRAVENOUS
  Filled 2023-08-23: qty 400

## 2023-08-23 MED ORDER — SODIUM CHLORIDE 0.9 % IV SOLN
2.0000 g | Freq: Once | INTRAVENOUS | Status: AC
  Administered 2023-08-23: 2 g via INTRAVENOUS
  Filled 2023-08-23: qty 20

## 2023-08-23 MED ORDER — CEPHALEXIN 500 MG PO CAPS: 500.0000 mg | ORAL_CAPSULE | Freq: Four times a day (QID) | ORAL | 0 refills | Status: AC

## 2023-08-23 MED ORDER — MORPHINE SULFATE (PF) 4 MG/ML IV SOLN
4.0000 mg | Freq: Once | INTRAVENOUS | Status: AC
  Administered 2023-08-23: 4 mg via INTRAVENOUS
  Filled 2023-08-23: qty 1

## 2023-08-23 MED ORDER — ONDANSETRON HCL 4 MG/2ML IJ SOLN
4.0000 mg | Freq: Once | INTRAMUSCULAR | Status: AC
  Administered 2023-08-23: 4 mg via INTRAVENOUS
  Filled 2023-08-23: qty 2

## 2023-08-23 NOTE — ED Provider Notes (Signed)
 [facility] Provider Note    Event Date/Time   First MD Initiated Contact with Patient 08/23/23 [phone]     (approximate)   History   Post-op Problem   HPI  Phillip Lam is a 78 y.o. male with history of hypertension, hyperlipidemia, COPD who presents to the emergency department for concerns for right-sided facial swelling, redness, drainage of pus.  He is concerned for infection.  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  He denies any fevers.  He is not on antibiotics.  He is not on any topical medications.  He has not followed up with his dermatologist yet.   History provided by patient.    Past Medical History:  Diagnosis Date   Aortic atherosclerosis (HCC)    Carpal tunnel syndrome    COPD (chronic obstructive pulmonary disease) (HCC)    Coronary artery calcification    Hyperlipidemia    Hypertension    Impaired fasting glucose     Past Surgical History:  Procedure Laterality Date   CARPAL TUNNEL RELEASE Bilateral    COLONOSCOPY WITH PROPOFOL  N/A 09/08/2016   Procedure: COLONOSCOPY WITH PROPOFOL ;  Surgeon: Garrett Kallman, MD;  Location: [facility] ENDOSCOPY;  Service: Endoscopy;  Laterality: N/A;   FRACTURE SURGERY     left elbow    MEDICATIONS:  Prior to Admission medications   Medication Sig Start Date End Date Taking? Authorizing Provider  acetaminophen (TYLENOL) 500 MG tablet as needed.    [provider]  albuterol  (PROVENTIL  HFA;VENTOLIN  HFA) 108 (90 Base) MCG/ACT inhaler Inhale 2 puffs into the lungs every 4 (four) hours as needed for wheezing or shortness of breath.    [provider]  albuterol  (VENTOLIN  HFA) 108 (90 Base) MCG/ACT inhaler Inhale 2 inhalations into the lungs 4 (four) times daily as needed for Wheezing 06/22/23     aspirin EC 81 MG tablet Take 81 mg by mouth daily.    [provider]  budesonide-formoterol (SYMBICORT) 80-4.5 MCG/ACT inhaler Inhale 2  puffs into the lungs 2 (two) times daily.    [provider]  cephALEXin  (KEFLEX ) 500 MG capsule Take 1 capsule (500 mg total) by mouth 4 (four) times daily. 06/08/23     fluticasone -salmeterol (WIXELA INHUB ) 500-50 MCG/ACT AEPB Inhale 1 puff into the lungs every 12 (twelve) hours. 11/26/22   Little Riff, MD  lisinopril -hydrochlorothiazide  (ZESTORETIC ) 20-12.5 MG tablet Take 1 tablet by mouth daily. 08/03/23     methocarbamol  (ROBAXIN ) 500 MG tablet Take 1 tablet (500 mg total) by mouth 4 (four) times daily. 07/21/23     Multiple Vitamins-Minerals (MULTIVITAMIN WITH MINERALS) tablet Take 1 tablet by mouth daily.    [provider]  mupirocin  ointment (BACTROBAN ) 2 % Apply a thin layer to affected area on left forearm 2 (two) times daily for 7 days. 06/11/23     rosuvastatin  (CRESTOR ) 20 MG tablet Take 1 tablet (20 mg total) by mouth daily. 10/25/21 07/02/22  End, Veryl Gottron, MD  rosuvastatin  (CRESTOR ) 20 MG tablet Take 1 tablet (20 mg total) by mouth daily. 08/19/23     Semaglutide ,0.25 or 0.5MG /DOS, (OZEMPIC , 0.25 OR 0.5 MG/DOSE,) 2 MG/3ML SOPN Inject 0.25 mg into the skin once a week. 05/26/23     Semaglutide -Weight Management (WEGOVY ) 0.25 MG/0.5ML SOAJ Inject 0.25 mg (0.5 mls) into the skin once a week. 05/01/23       Physical Exam   Triage Vital Signs: ED Triage Vitals  Encounter Vitals Group  BP 08/22/23 2139 (!) 154/71     Systolic BP Percentile --      Diastolic BP Percentile --      Pulse Rate 08/22/23 2139 78     Resp 08/22/23 2139 16     Temp 08/22/23 2139 98.5 F (36.9 C)     Temp Source 08/22/23 2139 Oral     SpO2 08/22/23 2139 95 %     Weight 08/22/23 2142 230 lb (104.3 kg)     Height 08/22/23 2142 5\' 7"  (1.702 m)     Head Circumference --      Peak Flow --      Pain Score 08/22/23 2142 0     Pain Loc --      Pain Education --      Exclude from Growth Chart --     Most recent vital signs: Vitals:   08/23/23 0225 08/23/23 0515  BP: 132/71 (!) 124/53   Pulse: (!) 59 60  Resp: 17 18  Temp: 97.6 F (36.4 C) 97.8 F (36.6 C)  SpO2: 96% 100%    CONSTITUTIONAL: Alert, responds appropriately to questions. Well-appearing; well-nourished HEAD: Normocephalic, atraumatic EYES: Conjunctivae clear, pupils appear equal, sclera nonicteric.  No chemosis, hypopyon, hyphema, subconjunctival hemorrhage.  He does have periorbital swelling.  Just lateral to the right eye there is an area of purulent drainage. ENT: normal nose; moist mucous membranes, patient has increased redness and warmth noted to the right side of the face.  No induration. NECK: Supple, normal ROM CARD: RRR; S1 and S2 appreciated RESP: Normal chest excursion without splinting or tachypnea; breath sounds clear and equal bilaterally; no wheezes, no rhonchi, no rales, no hypoxia or respiratory distress, speaking full sentences ABD/GI: Non-distended; soft, non-tender, no rebound, no guarding, no peritoneal signs BACK: The back appears normal EXT: Normal ROM in all joints; no deformity noted, no edema SKIN: Normal color for age and race; warm; no rash on exposed skin NEURO: Moves all extremities equally, normal speech PSYCH: The patient's mood and manner are appropriate.       Patient gave verbal permission to utilize photo for medical documentation only. The image was not stored on any personal device.  ED Results / Procedures / Treatments   LABS: (all labs ordered are listed, but only abnormal results are displayed) Labs Reviewed  BASIC METABOLIC PANEL WITH GFR - Abnormal; Notable for the following components:      Result Value   Glucose, Bld 136 (*)    BUN 40 (*)    Creatinine, Ser 1.29 (*)    GFR, Estimated 58 (*)    All other components within normal limits  CBC WITH DIFFERENTIAL/PLATELET - Abnormal; Notable for the following components:   WBC 14.0 (*)    Neutro Abs 8.6 (*)    Monocytes Absolute 1.3 (*)    All other components within normal limits  CULTURE, BLOOD  (SINGLE)  AEROBIC/ANAEROBIC CULTURE W GRAM STAIN (SURGICAL/DEEP WOUND)     EKG:  EKG Interpretation Date/Time:    Ventricular Rate:    PR Interval:    QRS Duration:    QT Interval:    QTC Calculation:   R Axis:      Text Interpretation:           RADIOLOGY: My personal review and interpretation of imaging: CT scan shows preseptal cellulitis.  No postseptal cellulitis or abscess.  I have personally reviewed all radiology reports.   CT Maxillofacial W Contrast Result Date: 08/23/2023 CLINICAL DATA:  Initial evaluation for right-sided facial cellulitis. EXAM: CT MAXILLOFACIAL WITH CONTRAST TECHNIQUE: Multidetector CT imaging of the maxillofacial structures was performed with intravenous contrast. Multiplanar CT image reconstructions were also generated. RADIATION DOSE REDUCTION: This exam was performed according to the departmental dose-optimization program which includes automated exposure control, adjustment of the mA and/or kV according to patient size and/or use of iterative reconstruction technique. CONTRAST:  75mL OMNIPAQUE IOHEXOL 300 MG/ML  SOLN COMPARISON:  None Available. FINDINGS: Osseous: No acute osseous abnormality. No discrete or worrisome osseous lesions. Degenerative spondylosis noted within the partially visualized cervical spine. Patient is edentulous. Orbits: Globes within normal limits. No evidence for intraorbital or postseptal cellulitis. Asymmetric prominence of the left superior ophthalmic vein noted, possibly reflecting and orbital varix. No visible abnormality about the cavernous sinus. Sinuses: Minor mucosal thickening noted about the ethmoidal air cells and right maxillary sinus. Paranasal sinuses are otherwise largely clear. Mastoid air cells and middle ear cavities are well pneumatized and free of fluid. Soft tissues: Soft tissue swelling with hazy inflammatory stranding noted involving the preseptal right periorbital soft tissues, presumably reflecting changes  of acute infection/cellulitis given provided history. Involvement of the upper and lower eyelids noted. Focal soft tissue defect lateral to the right orbit, presumably related to provided history of Mohs procedure. No discrete abscess or drainable fluid collection. No CT evidence for intraorbital or postseptal cellulitis at this time. Limited intracranial: Unremarkable. IMPRESSION: 1. Soft tissue swelling with hazy inflammatory stranding involving the preseptal right periorbital soft tissues, suspicious for acute infection/cellulitis. No discrete abscess or drainable fluid collection. No CT evidence for intraorbital or postseptal cellulitis at this time. 2. Superimposed soft tissue defect lateral to the right orbit, presumably reflecting sequelae of prior Mohs procedure given provided history. Electronically Signed   By: Virgia Griffins M.D.   On: 08/23/2023 02:41     PROCEDURES:  Critical Care performed: Yes, see critical care procedure note(s)   CRITICAL CARE Performed by: Starling Eck Khristine Verno   Total critical care time: 30 minutes  Critical care time was exclusive of separately billable procedures and treating other patients.  Critical care was necessary to treat or prevent imminent or life-threatening deterioration.  Critical care was time spent personally by me on the following activities: development of treatment plan with patient and/or surrogate as well as nursing, discussions with consultants, evaluation of patient's response to treatment, examination of patient, obtaining history from patient or surrogate, ordering and performing treatments and interventions, ordering and review of laboratory studies, ordering and review of radiographic studies, pulse oximetry and re-evaluation of patient's condition.   Procedures    IMPRESSION / MDM / ASSESSMENT AND PLAN / ED COURSE  I reviewed the triage vital signs and the nursing notes.    Patient here with cellulitis with purulent drainage  coming from incision site just lateral to the right eye.  The patient is on the cardiac monitor to evaluate for evidence of arrhythmia and/or significant heart rate changes.   DIFFERENTIAL DIAGNOSIS (includes but not limited to):   Preseptal cellulitis, postseptal cellulitis, developing abscess, sepsis   Patient's presentation is most consistent with acute presentation with potential threat to life or bodily function.   PLAN: Will obtain labs, wound culture of the purulent drainage, blood culture.  Will give Rocephin, vancomycin for broad coverage.  Will give pain medication.  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  CT of the face pending for  further evaluation.   MEDICATIONS GIVEN IN ED: Medications  cefTRIAXone (ROCEPHIN) 2 g in sodium chloride  0.9 % 100 mL IVPB (0 g Intravenous Stopped 08/23/23 0223)  morphine (PF) 4 MG/ML injection 4 mg (4 mg Intravenous Given 08/23/23 0103)  ondansetron  (ZOFRAN ) injection 4 mg (4 mg Intravenous Given 08/23/23 0102)  sodium chloride  0.9 % bolus 500 mL (0 mLs Intravenous Stopped 08/23/23 0223)  vancomycin (VANCOREADY) IVPB 2000 mg/400 mL (0 mg Intravenous Stopped 08/23/23 0429)  iohexol (OMNIPAQUE) 300 MG/ML solution 75 mL (75 mLs Intravenous Contrast Given 08/23/23 0126)     ED COURSE: Patient's labs show leukocytosis of 14,000 with left shift.  Creatinine minimally elevated at 1.29.  CT of the face shows preseptal cellulitis but no discrete abscess or drainable fluid collection.  No postseptal or intraorbital cellulitis.  Have again recommended admission.  Patient states he wants time to think about it further.  5:00 AM  Nurse and myself have encouraged admission multiple times for IV antibiotics however patient declines.  Will discharge with oral antibiotics recommend close follow-up with his dermatologist and discussed at length return precautions.  Recommended that he return at  any time if he change his mind.  He is aware of risks including worsening infection, postseptal cellulitis, loss of vision.  Has capacity to make decisions for himself.  Will discharge.   CONSULTS: Hospitalization recommended however patient declines.   OUTSIDE RECORDS REVIEWED: Reviewed recent dermatology note at [facility].       FINAL CLINICAL IMPRESSION(S) / ED DIAGNOSES   Final diagnoses:  Preseptal cellulitis     Rx / DC Orders   ED Discharge Orders          Ordered    cephALEXin  (KEFLEX ) 500 MG capsule  4 times daily        08/23/23 0441    doxycycline (VIBRA-TABS) 100 MG tablet  2 times daily        08/23/23 0441             Note:  This document was prepared using Dragon voice recognition software and may include unintentional dictation errors.   Denisha Hoel, Clover Dao, DO 08/23/23 [phone]

## 2023-08-23 NOTE — Discharge Instructions (Addendum)
 We have strongly recommended admission to the hospital for your facial cellulitis which you have declined at this time.  Please take your antibiotics until complete.  Please follow-up with your dermatologist first thing Monday morning.  Please return to the emergency department at anytime if you change your mind and would like admission or if symptoms are worsening.

## 2023-08-24 ENCOUNTER — Other Ambulatory Visit: Payer: Self-pay

## 2023-08-28 ENCOUNTER — Other Ambulatory Visit: Payer: Self-pay

## 2023-08-28 LAB — AEROBIC/ANAEROBIC CULTURE W GRAM STAIN (SURGICAL/DEEP WOUND)

## 2023-08-28 LAB — CULTURE, BLOOD (SINGLE): Culture: NO GROWTH

## 2023-08-28 MED ORDER — ALBUTEROL SULFATE HFA 108 (90 BASE) MCG/ACT IN AERS
2.0000 | INHALATION_SPRAY | Freq: Four times a day (QID) | RESPIRATORY_TRACT | 0 refills | Status: DC | PRN
  Filled 2023-08-28: qty 8.5, 16d supply, fill #0

## 2023-08-31 ENCOUNTER — Other Ambulatory Visit: Payer: Self-pay

## 2023-08-31 MED ORDER — LISINOPRIL-HYDROCHLOROTHIAZIDE 20-12.5 MG PO TABS
1.0000 | ORAL_TABLET | Freq: Every day | ORAL | 3 refills | Status: DC
  Filled 2023-08-31: qty 30, 30d supply, fill #0
  Filled 2023-09-29: qty 30, 30d supply, fill #1
  Filled 2023-11-04: qty 30, 30d supply, fill #2
  Filled 2023-12-01: qty 30, 30d supply, fill #3

## 2023-09-15 ENCOUNTER — Other Ambulatory Visit: Payer: Self-pay

## 2023-09-22 NOTE — Progress Notes (Unsigned)
 Cardiology Office Note    Date:  09/23/2023   ID:  Phillip Lam, DOB 07/30/1945, MRN [id number]  PCP:  Phillip Norleen BIRCH, MD  Cardiologist:  Lonni Hanson, MD  Electrophysiologist:  None   Chief Complaint: Follow-up  History of Present Illness:   Phillip Lam is a 78 y.o. male with history of coronary artery calcification, HTN, HLD, COPD with chronic dyspnea, impaired glucose tolerance, and obesity who presents for follow-up of CAD.  Calcium  score in 05/2021 of 416 which was the 64th percentile.  Lexiscan  MPI in 07/2021 showed no evidence of infarction or ischemia with normal LV systolic function and was overall low risk.  CT attenuation corrected images showed mild aortic atherosclerosis and coronary artery calcification.  Overall, this was a low risk study.    He was last seen in the office in 06/2022 and reported an an episode of not feeling well associate with spikes in blood pressure that led to an ED visit in 04/2022 with unrevealing workup.  He also noted stable chronic dyspnea that he attributed to his underlying COPD and was limited by pain in both of his knees.  He was largely sedentary and caring for his wife.  Given chronic stable dyspnea he underwent echo in 07/2022 that showed an EF of 60 to 65%, no regional wall motion abnormalities, normal LV diastolic function parameters, normal RV systolic function and ventricular cavity size, and mild mitral regurgitation.  He comes in today noting several complaints including an approximately 71-month history of progressive exertional dyspnea on top of his baseline chronic shortness of breath, and exertional fatigue without frank chest pain.  He is concerned the symptoms may be related to progressive CAD.  He also notes some intermittent brief several second lasting palpitations that will sometimes occur once per day and sometimes once per week.  In this same setting he has noted an occasional sensation of near dizziness without associated frank  dizziness, near-syncope, or syncope.  He continues to be the primary caregiver for his wife with multiple comorbidities.  He reports he is the longest living male on his side of the family.   Labs independently reviewed: 07/2023 - Hgb 13.3, PLT 277, potassium 3.8, BUN 40, serum creatinine 1.29 10/2022 - albumin 4.8, AST/ALT normal, TC 182, TG 228, HDL 59, LDL 77  Past Medical History:  Diagnosis Date   Aortic atherosclerosis (HCC)    Carpal tunnel syndrome    COPD (chronic obstructive pulmonary disease) (HCC)    Coronary artery calcification    Hyperlipidemia    Hypertension    Impaired fasting glucose     Past Surgical History:  Procedure Laterality Date   CARPAL TUNNEL RELEASE Bilateral    COLONOSCOPY WITH PROPOFOL  N/A 09/08/2016   Procedure: COLONOSCOPY WITH PROPOFOL ;  Surgeon: Vicci Gladis POUR, MD;  Location: [facility];  Service: Endoscopy;  Laterality: N/A;   FRACTURE SURGERY     left elbow    Current Medications: Current Meds  Medication Sig   acetaminophen (TYLENOL) 500 MG tablet as needed.   albuterol  (VENTOLIN  HFA) 108 (90 Base) MCG/ACT inhaler Inhale 2 puffs into the lungs 4 (four) times daily as needed for wheezing.   aspirin EC 81 MG tablet Take 81 mg by mouth daily.   doxycycline  (VIBRA -TABS) 100 MG tablet Take 1 tablet (100 mg total) by mouth 2 (two) times daily.   fluticasone -salmeterol (WIXELA INHUB ) 500-50 MCG/ACT AEPB Inhale 1 puff into the lungs every 12 (twelve) hours.   lisinopril -hydrochlorothiazide  (ZESTORETIC ) 20-12.5 MG  tablet Take 1 tablet by mouth once daily   methocarbamol  (ROBAXIN ) 500 MG tablet Take 1 tablet (500 mg total) by mouth 4 (four) times daily.   metoprolol tartrate (LOPRESSOR) 100 MG tablet TAKE 1 TABLET BY MOUTH 2 HOURS PRIOR TO CARDIAC PROCEDURE   Multiple Vitamins-Minerals (MULTIVITAMIN WITH MINERALS) tablet Take 1 tablet by mouth daily.   mupirocin  ointment (BACTROBAN ) 2 % Apply a thin layer to affected area on left forearm 2 (two)  times daily for 7 days.   rosuvastatin  (CRESTOR ) 20 MG tablet Take 1 tablet (20 mg total) by mouth daily.   Semaglutide ,0.25 or 0.5MG /DOS, (OZEMPIC , 0.25 OR 0.5 MG/DOSE,) 2 MG/3ML SOPN Inject 0.25 mg into the skin once a week.   Semaglutide -Weight Management (WEGOVY ) 0.25 MG/0.5ML SOAJ Inject 0.25 mg (0.5 mls) into the skin once a week.   [DISCONTINUED] albuterol  (PROVENTIL  HFA;VENTOLIN  HFA) 108 (90 Base) MCG/ACT inhaler Inhale 2 puffs into the lungs every 4 (four) hours as needed for wheezing or shortness of breath.   [DISCONTINUED] lisinopril -hydrochlorothiazide  (ZESTORETIC ) 20-12.5 MG tablet Take 1 tablet by mouth daily.   [DISCONTINUED] rosuvastatin  (CRESTOR ) 20 MG tablet Take 1 tablet (20 mg total) by mouth daily.    Allergies:   Peanut oil   Social History   Socioeconomic History   Marital status: Married    Spouse name: Not on file   Number of children: Not on file   Years of education: Not on file   Highest education level: Not on file  Occupational History   Not on file  Tobacco Use   Smoking status: Former    Types: Cigars    Quit date: 2019    Years since quitting: 6.5   Smokeless tobacco: Never  Vaping Use   Vaping status: Never Used  Substance and Sexual Activity   Alcohol use: Not Currently   Drug use: Not Currently   Sexual activity: Not on file  Other Topics Concern   Not on file  Social History Narrative   Not on file   Social Drivers of Health   Financial Resource Strain: Low Risk  (05/22/2022)   Received from [facility]   Overall Financial Resource Strain (CARDIA)    Difficulty of Paying Living Expenses: Not hard at all  Food Insecurity: No Food Insecurity (05/22/2022)   [facility] from [facility]   Hunger Vital Sign    Within the past 12 months, you worried that your food would run out before you got the money to buy more.: Never true    Within the past 12 months, the food you bought just didn't last and you didn't  have money to get more.: Never true  Transportation Needs: No Transportation Needs (05/22/2022)   Received from [facility] - Transportation    In the past 12 months, has lack of transportation kept you from medical appointments or from getting medications?: No    Lack of Transportation (Non-Medical): No  Physical Activity: Not on file  Stress: Not on file  Social Connections: Not on file     Family History:  The patient's family history includes Coronary artery disease (age of onset: 56) in his brother; Heart attack in his brother; Heart attack (age of onset: 104) in his son; Heart disease in his father; Stroke (age of onset: 33) in his father; Stroke (age of onset: 27) in his mother.  ROS:   12-point review of systems is negative unless otherwise noted in the HPI.  EKGs/Labs/Other Studies Reviewed:    Studies reviewed were summarized above. The additional studies were reviewed today:  2D echo 08/04/2022: 1. Left ventricular ejection fraction, by estimation, is 60 to 65%. Left  ventricular ejection fraction by 2D MOD biplane is 67.0 %. The left  ventricle has normal function. The left ventricle has no regional wall  motion abnormalities. Left ventricular  diastolic parameters were normal.   2. Right ventricular systolic function is normal. The right ventricular  size is normal.   3. The mitral valve is normal in structure. Mild mitral valve  regurgitation.   4. The aortic valve was not well visualized. Aortic valve regurgitation  is not visualized.  __________  Lexiscan  MPI 08/05/2021:   The study is normal. The study is low risk.   No ST deviation was noted.   LV perfusion is normal. There is no evidence of ischemia. There is no evidence of infarction.   Left ventricular function is normal. End diastolic cavity size is normal. End systolic cavity size is normal.   CT attenuation images show evidence of mild aortic and coronary  calcifications. __________  Calcium  score 05/31/2021: FINDINGS: CORONARY CALCIUM  SCORES:   Left Main: 0   LAD: 362   LCx: 16.5   RCA: 37.1   Total Agatston Score: 416   MESA database percentile: 64   AORTA MEASUREMENTS:   Ascending Aorta: 31 mm   Descending Aorta: 28 mm   OTHER FINDINGS:   Heart is normal size. Aorta normal caliber. Scattered aortic calcifications. No adenopathy. Linear scarring in the lung bases. No confluent airspace opacities or effusions. No acute findings in the upper abdomen. Chest wall soft tissues are unremarkable. No acute bony abnormality.   IMPRESSION: Total Agatston score: 416   [place] database percentile: 64   Scattered aortic atherosclerosis.   No acute extra cardiac abnormality.    EKG:  EKG is ordered today.  The EKG ordered today demonstrates NSR, 71 bpm, no acute ST-T changes  Recent Labs: 08/22/2023: BUN 40; Creatinine, Ser 1.29; Hemoglobin 13.3; Platelets 277; Potassium 3.8; Sodium 136  Recent Lipid Panel No results found for: CHOL, TRIG, HDL, CHOLHDL, VLDL, LDLCALC, LDLDIRECT  PHYSICAL EXAM:    VS:  BP (!) 140/78 (BP Location: Left Arm, Patient Position: Sitting)   Pulse 71   Ht 5' 7 (1.702 m)   Wt 234 lb 3.2 oz (106.2 kg)   SpO2 96%   BMI 36.68 kg/m   BMI: Body mass index is 36.68 kg/m.  Physical Exam Vitals reviewed.  Constitutional:      Appearance: He is well-developed.  HENT:     Head: Normocephalic and atraumatic.  Eyes:     General:        Right eye: No discharge.        Left eye: No discharge.  Cardiovascular:     Rate and Rhythm: Normal rate and regular rhythm.     Heart sounds: Normal heart sounds, S1 normal and S2 normal. Heart sounds not distant. No midsystolic click and no opening snap. No murmur heard.    No friction rub.  Pulmonary:     Effort: Pulmonary effort is normal. No respiratory distress.     Breath sounds: Normal breath sounds. No decreased breath sounds, wheezing,  rhonchi or rales.  Chest:     Chest wall: No tenderness.  Musculoskeletal:     Cervical back: Normal range of motion.  Skin:    General: Skin is warm and dry.     Nails:  There is no clubbing.  Neurological:     Mental Status: He is alert and oriented to person, place, and time.  Psychiatric:        Speech: Speech normal.        Behavior: Behavior normal.        Thought Content: Thought content normal.        Judgment: Judgment normal.     Wt Readings from Last 3 Encounters:  09/23/23 234 lb 3.2 oz (106.2 kg)  08/22/23 230 lb (104.3 kg)  07/02/22 233 lb 2 oz (105.7 kg)     ASSESSMENT & PLAN:   CAD involving the native coronary arteries with exertional dyspnea concerning for anginal equivalent: Currently without symptoms of angina or cardiac decompensation.  He has a progressive 23-month history of exertional shortness of breath and fatigue concerning for anginal equivalent.  Schedule coronary CTA.  Echo just last year reassuring as outlined above.  Continue aspirin 81 mg and rosuvastatin  20 mg.  Palpitations: He notes several second lasting episodes of palpitations that can occur 1 time per day or sometimes once per week.  At times, it seems these palpitations are associated with a sensation of near dizziness.  Place Zio patch.  HTN: Blood pressure is mildly elevated in the office today though typically well-controlled.  He remains on lisinopril /HCTZ 20/12.5 mg.  Titrate as indicated.  HLD: LDL 77 in 10/2022.  He remains on rosuvastatin  20 mg.  Titrate lipid-lowering therapy as indicated following coronary CTA.  Chronic dyspnea: Historically, he has attributed this to underlying COPD.  Recent echo with preserved LV systolic function.  Schedule coronary CTA as outlined above to evaluate for ischemic heart disease.     Disposition: F/u with Dr. Mady or an APP in 2 months.   Medication Adjustments/Labs and Tests Ordered: Current medicines are reviewed at length with the patient  today.  Concerns regarding medicines are outlined above. Medication changes, Labs and Tests ordered today are summarized above and listed in the Patient Instructions accessible in Encounters.   Signed, [name], PA-C 09/23/2023 12:46 PM     [facility] [place] [address] [place], [place] [address] [phone]

## 2023-09-23 ENCOUNTER — Encounter: Payer: Self-pay | Admitting: Physician Assistant

## 2023-09-23 ENCOUNTER — Ambulatory Visit: Attending: Physician Assistant | Admitting: Physician Assistant

## 2023-09-23 ENCOUNTER — Ambulatory Visit

## 2023-09-23 ENCOUNTER — Other Ambulatory Visit: Payer: Self-pay

## 2023-09-23 VITALS — BP 140/78 | HR 71 | Ht 67.0 in | Wt 234.2 lb

## 2023-09-23 DIAGNOSIS — R0609 Other forms of dyspnea: Secondary | ICD-10-CM | POA: Diagnosis not present

## 2023-09-23 DIAGNOSIS — I10 Essential (primary) hypertension: Secondary | ICD-10-CM

## 2023-09-23 DIAGNOSIS — R002 Palpitations: Secondary | ICD-10-CM

## 2023-09-23 DIAGNOSIS — I25118 Atherosclerotic heart disease of native coronary artery with other forms of angina pectoris: Secondary | ICD-10-CM | POA: Diagnosis not present

## 2023-09-23 DIAGNOSIS — I2089 Other forms of angina pectoris: Secondary | ICD-10-CM

## 2023-09-23 DIAGNOSIS — E785 Hyperlipidemia, unspecified: Secondary | ICD-10-CM | POA: Diagnosis not present

## 2023-09-23 DIAGNOSIS — R072 Precordial pain: Secondary | ICD-10-CM | POA: Diagnosis not present

## 2023-09-23 MED ORDER — METOPROLOL TARTRATE 100 MG PO TABS
ORAL_TABLET | ORAL | 0 refills | Status: DC
  Filled 2023-09-23: qty 1, 1d supply, fill #0

## 2023-09-23 NOTE — Patient Instructions (Addendum)
 Medication Instructions:  Your physician recommends that you continue on your current medications as directed. Please refer to the Current Medication list given to you today.   *If you need a refill on your cardiac medications before your next appointment, please call your pharmacy*  Lab Work: Your provider would like for you to have following labs drawn today BMeT.   If you have labs (blood work) drawn today and your tests are completely normal, you will receive your results only by: MyChart Message (if you have MyChart) OR A paper copy in the mail If you have any lab test that is abnormal or we need to change your treatment, we will call you to review the results.  Testing/Procedures: Your physician has recommended that you wear a Zio monitor.   This monitor is a medical device that records the heart's electrical activity. Doctors most often use these monitors to diagnose arrhythmias. Arrhythmias are problems with the speed or rhythm of the heartbeat. The monitor is a small device applied to your chest. You can wear one while you do your normal daily activities. While wearing this monitor if you have any symptoms to push the button and record what you felt. Once you have worn this monitor for the period of time provider prescribed (Usually 14 days), you will return the monitor device in the postage paid box. Once it is returned they will download the data collected and provide us  with a report which the provider will then review and we will call you with those results. Important tips:  Avoid showering during the first 24 hours of wearing the monitor. Avoid excessive sweating to help maximize wear time. Do not submerge the device, no hot tubs, and no swimming pools. Keep any lotions or oils away from the patch. After 24 hours you may shower with the patch on. Take brief showers with your back facing the shower head.  Do not remove patch once it has been placed because that will interrupt data  and decrease adhesive wear time. Push the button when you have any symptoms and write down what you were feeling. Once you have completed wearing your monitor, remove and place into box which has postage paid and place in your outgoing mailbox.  If for some reason you have misplaced your box then call our office and we can provide another box and/or mail it off for you.     Your cardiac CT will be scheduled at the below location:   [facility] [address] [place], [place] [address] [phone]  If scheduled at [facility], please arrive 15 mins early for check-in and test prep.  Please follow these instructions carefully (unless otherwise directed):  An IV will be required for this test and Nitroglycerin will be given.  Hold all erectile dysfunction medications at least 3 days (72 hrs) prior to test. (Ie viagra, cialis, sildenafil, tadalafil, etc)   On the Night Before the Test: Be sure to Drink plenty of water. Do not consume any caffeinated/decaffeinated beverages or chocolate 12 hours prior to your test. Do not take any antihistamines 12 hours prior to your test.   On the Day of the Test: Drink plenty of water until 1 hour prior to the test. Do not eat any food 1 hour prior to test. You may take your regular medications prior to the test.  Take metoprolol (Lopressor) two hours prior to test. If you take Furosemide/Hydrochlorothiazide /Spironolactone/Chlorthalidone, please HOLD on the morning of the  test. Patients who wear a continuous glucose monitor MUST remove the device prior to scanning.       After the Test: Drink plenty of water. After receiving IV contrast, you may experience a mild flushed feeling. This is normal. On occasion, you may experience a mild rash up to 24 hours after the test. This is not dangerous. If this occurs, you can take Benadryl 25 mg, Zyrtec, Claritin, or Allegra and increase your fluid intake.  (Patients taking Tikosyn should avoid Benadryl, and may take Zyrtec, Claritin, or Allegra) If you experience trouble breathing, this can be serious. If it is severe call 911 IMMEDIATELY. If it is mild, please call our office.  We will call to schedule your test 2-4 weeks out understanding that some insurance companies will need an authorization prior to the service being performed.   For more information and frequently asked questions, please visit our website : [email or web address]  For non-scheduling related questions, please contact the cardiac imaging nurse navigator should you have any questions/concerns: Cardiac Imaging Nurse Navigators Direct Office Dial: [phone]   For scheduling needs, including cancellations and rescheduling, please call [place], [phone].   Follow-Up: At [facility], you and your health needs are our priority.  As part of our continuing mission to provide you with exceptional heart care, our providers are all part of one team.  This team includes your primary Cardiologist (physician) and Advanced Practice Providers or APPs (Physician Assistants and Nurse Practitioners) who all work together to provide you with the care you need, when you need it.  Your next appointment:   2-3 month(s)  Provider:   You may see Lonni Hanson, MD or [name], PA-C  We recommend signing up for the patient portal called MyChart.  Sign up information is provided on this After Visit Summary.  MyChart is used to connect with patients for Virtual Visits (Telemedicine).  Patients are able to view lab/test results, encounter notes, upcoming appointments, etc.  Non-urgent messages can be sent to your provider as well.   To learn more about what you can do with MyChart, go to [email or web address].

## 2023-09-24 ENCOUNTER — Ambulatory Visit: Payer: Self-pay | Admitting: Physician Assistant

## 2023-09-24 DIAGNOSIS — Z79899 Other long term (current) drug therapy: Secondary | ICD-10-CM

## 2023-09-24 LAB — BASIC METABOLIC PANEL WITH GFR
BUN/Creatinine Ratio: 21 (ref 10–24)
BUN: 26 mg/dL (ref 8–27)
CO2: 21 mmol/L (ref 20–29)
Calcium: 10.2 mg/dL (ref 8.6–10.2)
Chloride: 100 mmol/L (ref 96–106)
Creatinine, Ser: 1.26 mg/dL (ref 0.76–1.27)
Glucose: 91 mg/dL (ref 70–99)
Potassium: 5 mmol/L (ref 3.5–5.2)
Sodium: 139 mmol/L (ref 134–144)
eGFR: 59 mL/min/{1.73_m2} — ABNORMAL LOW (ref >59)

## 2023-09-29 ENCOUNTER — Other Ambulatory Visit: Payer: Self-pay

## 2023-10-07 ENCOUNTER — Telehealth (HOSPITAL_COMMUNITY): Payer: Self-pay | Admitting: Emergency Medicine

## 2023-10-07 NOTE — Telephone Encounter (Signed)
 Reaching out to patient to offer assistance regarding upcoming cardiac imaging study; pt verbalizes understanding of appt date/time, parking situation and where to check in, pre-test NPO status and medications ordered, and verified current allergies; name and call back number provided for further questions should they arise [facility] [place] RN Navigator Cardiac Imaging [facility] [phone] office [phone] cell

## 2023-10-08 ENCOUNTER — Other Ambulatory Visit: Payer: Self-pay | Admitting: Cardiology

## 2023-10-08 ENCOUNTER — Ambulatory Visit (HOSPITAL_COMMUNITY)
Admission: RE | Admit: 2023-10-08 | Discharge: 2023-10-08 | Disposition: A | Discharge status: Home / Self Care | Source: Ambulatory Visit | Attending: Cardiology | Admitting: Cardiology

## 2023-10-08 ENCOUNTER — Ambulatory Visit
Admission: RE | Admit: 2023-10-08 | Discharge: 2023-10-08 | Disposition: A | Discharge status: Home / Self Care | Source: Ambulatory Visit | Attending: Physician Assistant | Admitting: Physician Assistant

## 2023-10-08 ENCOUNTER — Other Ambulatory Visit (HOSPITAL_COMMUNITY): Payer: Self-pay | Admitting: *Deleted

## 2023-10-08 DIAGNOSIS — R931 Abnormal findings on diagnostic imaging of heart and coronary circulation: Secondary | ICD-10-CM | POA: Diagnosis not present

## 2023-10-08 DIAGNOSIS — I2089 Other forms of angina pectoris: Secondary | ICD-10-CM | POA: Diagnosis not present

## 2023-10-08 DIAGNOSIS — I25118 Atherosclerotic heart disease of native coronary artery with other forms of angina pectoris: Secondary | ICD-10-CM | POA: Diagnosis not present

## 2023-10-08 DIAGNOSIS — R0609 Other forms of dyspnea: Secondary | ICD-10-CM | POA: Diagnosis not present

## 2023-10-08 DIAGNOSIS — I2510 Atherosclerotic heart disease of native coronary artery without angina pectoris: Secondary | ICD-10-CM | POA: Diagnosis not present

## 2023-10-08 MED ORDER — DILTIAZEM HCL 25 MG/5ML IV SOLN: 10.0000 mg | INTRAVENOUS | Status: DC | PRN

## 2023-10-08 MED ORDER — IOHEXOL 350 MG/ML SOLN
100.0000 mL | Freq: Once | INTRAVENOUS | Status: AC | PRN
  Administered 2023-10-08: 100 mL via INTRAVENOUS

## 2023-10-08 MED ORDER — NITROGLYCERIN 0.4 MG SL SUBL
0.8000 mg | SUBLINGUAL_TABLET | Freq: Once | SUBLINGUAL | Status: AC
  Administered 2023-10-08: 0.8 mg via SUBLINGUAL

## 2023-10-08 MED ORDER — METOPROLOL TARTRATE 5 MG/5ML IV SOLN: 10.0000 mg | Freq: Once | INTRAVENOUS | Status: DC | PRN

## 2023-10-14 DIAGNOSIS — Z79899 Other long term (current) drug therapy: Secondary | ICD-10-CM | POA: Diagnosis not present

## 2023-10-15 LAB — LIPID PANEL
Chol/HDL Ratio: 2.9 ratio (ref 0.0–5.0)
Cholesterol, Total: 172 mg/dL (ref 100–199)
HDL: 59 mg/dL (ref >39)
LDL Chol Calc (NIH): 84 mg/dL (ref 0–99)
Triglycerides: 172 mg/dL — ABNORMAL HIGH (ref 0–149)
VLDL Cholesterol Cal: 29 mg/dL (ref 5–40)

## 2023-10-15 LAB — ALT: ALT: 28 IU/L (ref 0–44)

## 2023-10-15 MED ORDER — ROSUVASTATIN CALCIUM 40 MG PO TABS
40.0000 mg | ORAL_TABLET | Freq: Every day | ORAL | 3 refills | Status: AC
  Filled 2023-10-16: qty 90, 90d supply, fill #0
  Filled 2024-02-01 (×2): qty 90, 90d supply, fill #1
  Filled 2024-04-27: qty 90, 90d supply, fill #2

## 2023-10-16 ENCOUNTER — Other Ambulatory Visit: Payer: Self-pay

## 2023-10-19 DIAGNOSIS — Z85820 Personal history of malignant melanoma of skin: Secondary | ICD-10-CM | POA: Diagnosis not present

## 2023-10-19 DIAGNOSIS — D2271 Melanocytic nevi of right lower limb, including hip: Secondary | ICD-10-CM | POA: Diagnosis not present

## 2023-10-19 DIAGNOSIS — C44311 Basal cell carcinoma of skin of nose: Secondary | ICD-10-CM | POA: Diagnosis not present

## 2023-10-19 DIAGNOSIS — Z85828 Personal history of other malignant neoplasm of skin: Secondary | ICD-10-CM | POA: Diagnosis not present

## 2023-10-19 DIAGNOSIS — D2262 Melanocytic nevi of left upper limb, including shoulder: Secondary | ICD-10-CM | POA: Diagnosis not present

## 2023-10-19 DIAGNOSIS — D225 Melanocytic nevi of trunk: Secondary | ICD-10-CM | POA: Diagnosis not present

## 2023-10-19 DIAGNOSIS — L821 Other seborrheic keratosis: Secondary | ICD-10-CM | POA: Diagnosis not present

## 2023-10-19 DIAGNOSIS — Z08 Encounter for follow-up examination after completed treatment for malignant neoplasm: Secondary | ICD-10-CM | POA: Diagnosis not present

## 2023-10-19 DIAGNOSIS — D2272 Melanocytic nevi of left lower limb, including hip: Secondary | ICD-10-CM | POA: Diagnosis not present

## 2023-10-19 DIAGNOSIS — C4401 Basal cell carcinoma of skin of lip: Secondary | ICD-10-CM | POA: Diagnosis not present

## 2023-10-19 DIAGNOSIS — D2261 Melanocytic nevi of right upper limb, including shoulder: Secondary | ICD-10-CM | POA: Diagnosis not present

## 2023-10-23 ENCOUNTER — Other Ambulatory Visit: Payer: Self-pay

## 2023-10-26 ENCOUNTER — Other Ambulatory Visit: Payer: Self-pay

## 2023-11-04 ENCOUNTER — Other Ambulatory Visit: Payer: Self-pay

## 2023-11-09 ENCOUNTER — Other Ambulatory Visit: Payer: Self-pay

## 2023-11-09 MED ORDER — ALBUTEROL SULFATE HFA 108 (90 BASE) MCG/ACT IN AERS
2.0000 | INHALATION_SPRAY | Freq: Four times a day (QID) | RESPIRATORY_TRACT | 0 refills | Status: DC | PRN
  Filled 2023-11-09: qty 8.5, 16d supply, fill #0

## 2023-11-16 DIAGNOSIS — R002 Palpitations: Secondary | ICD-10-CM | POA: Diagnosis not present

## 2023-11-20 DIAGNOSIS — Z125 Encounter for screening for malignant neoplasm of prostate: Secondary | ICD-10-CM | POA: Diagnosis not present

## 2023-11-20 DIAGNOSIS — I10 Essential (primary) hypertension: Secondary | ICD-10-CM | POA: Diagnosis not present

## 2023-11-25 ENCOUNTER — Ambulatory Visit: Attending: Internal Medicine | Admitting: Internal Medicine

## 2023-11-25 ENCOUNTER — Encounter: Payer: Self-pay | Admitting: Internal Medicine

## 2023-11-25 VITALS — BP 126/73 | HR 71 | Ht 66.0 in | Wt 231.4 lb

## 2023-11-25 DIAGNOSIS — I2510 Atherosclerotic heart disease of native coronary artery without angina pectoris: Secondary | ICD-10-CM

## 2023-11-25 DIAGNOSIS — R002 Palpitations: Secondary | ICD-10-CM

## 2023-11-25 DIAGNOSIS — E782 Mixed hyperlipidemia: Secondary | ICD-10-CM | POA: Diagnosis not present

## 2023-11-25 DIAGNOSIS — R0609 Other forms of dyspnea: Secondary | ICD-10-CM

## 2023-11-25 DIAGNOSIS — I10 Essential (primary) hypertension: Secondary | ICD-10-CM

## 2023-11-25 NOTE — Patient Instructions (Signed)

## 2023-11-25 NOTE — Progress Notes (Unsigned)
 Cardiology Office Note:  .   Date:  11/26/2023  ID:  Phillip Lam, DOB 09-12-1945, MRN [id number] PCP: Phillip Lam BIRCH, MD  [facility] Providers Cardiologist:  Lonni Hanson, MD     History of Present Illness: .   Phillip Lam is a 78 y.o. male with history of coronary artery calcification, hypertension, hyperlipidemia, impaired glucose tolerance, COPD, and obesity, who presents for follow-up of dyspnea on exertion and palpitations.  He was last seen in July by Phillip Bring, PA, at which time he reported progressive exertional dyspnea and fatigue as well as occasional palpitations.  He was referred for coronary CTA, which showed moderate CAD that was not hemodynamically significant by CT FFR.  Escalation of statin therapy was recommended.  Event monitor showed predominantly sinus rhythm with rare PACs and PVCs.  A few brief supraventricular runs and one 4 beat episode of NSVT were also noted.  Today, Phillip Lam reports that he has been feeling fairly well though, though the last few months have been challenging due to skin infections following Mohs surgery to the left arm and face.  He had been feeling better up until the last week but now feels like something is not right.  He is unable to characterize this further but notes that there were some mild abnormalities on his recent VCE ordered by Dr. Rudolpho.  He is scheduled for follow-up with Dr. Rudolpho to discuss this further.  Has chronic exertional dyspnea, which Phillip Lam attributes to his COPD, has been stable.  He has not had any further chest pain.  He sometimes feels a little dizzy but has not had any frank lightheadedness.  He also denies palpitations and edema.  He is tolerating rosuvastatin  40 mg daily well.  ROS: See HPI  Studies Reviewed: SABRA       Event monitor (09/23/2023): Predominantly sinus rhythm with rare PACs and PVCs.  3 supraventricular runs lasting up to 10 beats and 1 4 beat episode of NSVT noted.  Coronary CTA  (10/08/2023): Moderate CAD with 50-69% proximal LAD stenosis that is not hemodynamically significant by CT FFR.  Coronary calcium  score 713 (72nd percentile for age and sex matched controls).  No significant extracardiac findings.  Risk Assessment/Calculations:             Physical Exam:   VS:  BP 126/73   Pulse 71   Ht 5' 6 (1.676 m)   Wt 231 lb 6.4 oz (105 kg)   SpO2 94%   BMI 37.35 kg/m    Wt Readings from Last 3 Encounters:  11/25/23 231 lb 6.4 oz (105 kg)  09/23/23 234 lb 3.2 oz (106.2 kg)  08/22/23 230 lb (104.3 kg)    General:  NAD. Neck: No JVD or HJR. Lungs: Clear to auscultation bilaterally without wheezes or crackles. Heart: Regular rate and rhythm with 1/6 systolic murmur. Abdomen: Soft, nontender, nondistended. Extremities: No lower extremity edema.  ASSESSMENT AND PLAN: .    Coronary artery disease and hyperlipidemia: Recent coronary CTA showed moderate, nonobstructive CAD predominantly involving the LAD.  We will plan to continue aspirin 81 mg daily and rosuvastatin  40 mg daily to prevent progression of disease.  Recent lipid panel by Dr. Rudolpho showed improved LDL of 60.  Triglycerides were mildly elevated, although I suspect the sample may not have been fasting.  Dyspnea on exertion: Chronic and stable, likely related to COPD.  I do not think moderate CAD noted on recent CTA explains this.  Echocardiogram last year was  also reassuring without significant abnormalities.  No further cardiac workup recommended at this time.  Palpitations: No significant palpitations reported.  Recent event monitor showed a few brief supraventricular and ventricular runs.  Overall monitor was reassuring.  No further intervention at this time.  Hypertension: Blood pressure well-controlled today.  Continue current dose of lisinopril -HCTZ.    Dispo: Return to clinic in 1 year.  Signed, Lonni Hanson, MD

## 2023-11-26 ENCOUNTER — Encounter: Payer: Self-pay | Admitting: Internal Medicine

## 2023-11-26 DIAGNOSIS — R002 Palpitations: Secondary | ICD-10-CM | POA: Insufficient documentation

## 2023-11-27 DIAGNOSIS — Z125 Encounter for screening for malignant neoplasm of prostate: Secondary | ICD-10-CM | POA: Diagnosis not present

## 2023-11-27 DIAGNOSIS — Z1211 Encounter for screening for malignant neoplasm of colon: Secondary | ICD-10-CM | POA: Diagnosis not present

## 2023-11-27 DIAGNOSIS — Z0000 Encounter for general adult medical examination without abnormal findings: Secondary | ICD-10-CM | POA: Diagnosis not present

## 2023-11-27 DIAGNOSIS — I10 Essential (primary) hypertension: Secondary | ICD-10-CM | POA: Diagnosis not present

## 2023-11-27 DIAGNOSIS — E785 Hyperlipidemia, unspecified: Secondary | ICD-10-CM | POA: Diagnosis not present

## 2023-11-27 DIAGNOSIS — Z1331 Encounter for screening for depression: Secondary | ICD-10-CM | POA: Diagnosis not present

## 2023-11-27 DIAGNOSIS — J449 Chronic obstructive pulmonary disease, unspecified: Secondary | ICD-10-CM | POA: Diagnosis not present

## 2023-11-27 DIAGNOSIS — Z0001 Encounter for general adult medical examination with abnormal findings: Secondary | ICD-10-CM | POA: Diagnosis not present

## 2023-11-27 DIAGNOSIS — I2510 Atherosclerotic heart disease of native coronary artery without angina pectoris: Secondary | ICD-10-CM | POA: Diagnosis not present

## 2023-11-30 ENCOUNTER — Ambulatory Visit

## 2023-12-01 ENCOUNTER — Other Ambulatory Visit: Payer: Self-pay

## 2023-12-02 ENCOUNTER — Other Ambulatory Visit: Payer: Self-pay

## 2023-12-02 DIAGNOSIS — J449 Chronic obstructive pulmonary disease, unspecified: Secondary | ICD-10-CM | POA: Diagnosis not present

## 2023-12-02 DIAGNOSIS — R0609 Other forms of dyspnea: Secondary | ICD-10-CM | POA: Diagnosis not present

## 2023-12-02 DIAGNOSIS — Z23 Encounter for immunization: Secondary | ICD-10-CM | POA: Diagnosis not present

## 2023-12-02 MED ORDER — TRELEGY ELLIPTA 100-62.5-25 MCG/ACT IN AEPB
1.0000 | INHALATION_SPRAY | Freq: Every day | RESPIRATORY_TRACT | 4 refills | Status: AC
  Filled 2023-12-02: qty 60, 30d supply, fill #0

## 2023-12-02 MED ORDER — FLUTICASONE-SALMETEROL 500-50 MCG/ACT IN AEPB
1.0000 | INHALATION_SPRAY | Freq: Two times a day (BID) | RESPIRATORY_TRACT | 8 refills | Status: AC
  Filled 2023-12-02: qty 60, 30d supply, fill #0

## 2023-12-03 ENCOUNTER — Other Ambulatory Visit: Payer: Self-pay

## 2023-12-04 ENCOUNTER — Other Ambulatory Visit: Payer: Self-pay

## 2023-12-04 MED ORDER — TRELEGY ELLIPTA 100-62.5-25 MCG/ACT IN AEPB: 1.0000 | INHALATION_SPRAY | Freq: Every day | RESPIRATORY_TRACT | 4 refills | Status: AC

## 2023-12-28 ENCOUNTER — Other Ambulatory Visit: Payer: Self-pay

## 2023-12-28 MED ORDER — TRELEGY ELLIPTA 100-62.5-25 MCG/ACT IN AEPB
1.0000 | INHALATION_SPRAY | Freq: Every day | RESPIRATORY_TRACT | 4 refills | Status: AC
  Filled 2023-12-28: qty 60, 30d supply, fill #0
  Filled 2024-01-25: qty 60, 30d supply, fill #1
  Filled 2024-03-01: qty 60, 30d supply, fill #2
  Filled 2024-03-30: qty 60, 30d supply, fill #3

## 2023-12-28 MED ORDER — LISINOPRIL-HYDROCHLOROTHIAZIDE 20-12.5 MG PO TABS
1.0000 | ORAL_TABLET | Freq: Every day | ORAL | 3 refills | Status: DC
  Filled 2023-12-28: qty 30, 30d supply, fill #0
  Filled 2024-02-01 (×2): qty 30, 30d supply, fill #1
  Filled 2024-03-04: qty 30, 30d supply, fill #2
  Filled 2024-03-30: qty 30, 30d supply, fill #3

## 2023-12-31 ENCOUNTER — Other Ambulatory Visit: Payer: Self-pay

## 2023-12-31 ENCOUNTER — Other Ambulatory Visit (HOSPITAL_COMMUNITY): Payer: Self-pay

## 2024-01-08 NOTE — Progress Notes (Signed)
 Piper Albro                                          MRN: [id number]   01/08/2024   The VBCI Quality Team Specialist reviewed this patient medical record for the purposes of chart review for care gap closure. The following were reviewed: chart review for care gap closure-controlling blood pressure.    VBCI Quality Team

## 2024-01-11 DIAGNOSIS — Z1211 Encounter for screening for malignant neoplasm of colon: Secondary | ICD-10-CM | POA: Diagnosis not present

## 2024-01-22 LAB — COLOGUARD: COLOGUARD: NEGATIVE

## 2024-01-25 ENCOUNTER — Other Ambulatory Visit: Payer: Self-pay

## 2024-02-01 ENCOUNTER — Other Ambulatory Visit: Payer: Self-pay

## 2024-02-01 MED ORDER — ALBUTEROL SULFATE HFA 108 (90 BASE) MCG/ACT IN AERS
2.0000 | INHALATION_SPRAY | Freq: Four times a day (QID) | RESPIRATORY_TRACT | 0 refills | Status: AC | PRN
  Filled 2024-02-01: qty 6.7, 30d supply, fill #0

## 2024-02-29 ENCOUNTER — Ambulatory Visit

## 2024-02-29 DIAGNOSIS — C4490 Unspecified malignant neoplasm of skin, unspecified: Secondary | ICD-10-CM

## 2024-02-29 DIAGNOSIS — L738 Other specified follicular disorders: Secondary | ICD-10-CM | POA: Diagnosis not present

## 2024-02-29 DIAGNOSIS — L814 Other melanin hyperpigmentation: Secondary | ICD-10-CM | POA: Diagnosis not present

## 2024-02-29 DIAGNOSIS — D489 Neoplasm of uncertain behavior, unspecified: Secondary | ICD-10-CM | POA: Diagnosis not present

## 2024-02-29 DIAGNOSIS — L84 Corns and callosities: Secondary | ICD-10-CM | POA: Diagnosis not present

## 2024-02-29 DIAGNOSIS — W908XXA Exposure to other nonionizing radiation, initial encounter: Secondary | ICD-10-CM | POA: Diagnosis not present

## 2024-02-29 DIAGNOSIS — Z1283 Encounter for screening for malignant neoplasm of skin: Secondary | ICD-10-CM | POA: Diagnosis not present

## 2024-02-29 DIAGNOSIS — L578 Other skin changes due to chronic exposure to nonionizing radiation: Secondary | ICD-10-CM | POA: Diagnosis not present

## 2024-02-29 DIAGNOSIS — L905 Scar conditions and fibrosis of skin: Secondary | ICD-10-CM | POA: Diagnosis not present

## 2024-02-29 DIAGNOSIS — Z85820 Personal history of malignant melanoma of skin: Secondary | ICD-10-CM

## 2024-02-29 DIAGNOSIS — L821 Other seborrheic keratosis: Secondary | ICD-10-CM | POA: Diagnosis not present

## 2024-02-29 NOTE — Patient Instructions (Addendum)

## 2024-02-29 NOTE — Progress Notes (Signed)
 Subjective   Phillip Lam is a 78 y.o. male who presents for the following: Lesion(s) of concern . Patient is new patient  Today patient reports: Areas of concern on the face Area of concern on the right foot   Review of Systems:    History of melanoma ROS - denies any fevers, chills, weight loss, night sweats, lymphadenopathy, nausea, vomiting, diarrhea, chest pain, shortness of breath, headaches, changes in vision  .  The following portions of the chart were reviewed this encounter and updated as appropriate: medications, allergies, medical history  Relevant Medical History:  Personal history of non melanoma skin cancer - see medical history for full details  and Personal history of melanoma - see medical history for full details   Objective  (SKPE) Well appearing patient in no apparent distress; mood and affect are within normal limits. Examination was performed of the: Focused Exam of: Face and right foot   Examination notable for: Lentigo/lentigines: Scattered pigmented macules that are tan to brown in color and are somewhat non-uniform in shape and concentrated in the sun-exposed areas, Seborrheic Keratosis(es): Stuck-on appearing keratotic papule(s) on the trunk, none  irritated with redness, crusting, edema, and/or partial avulsion, Actinic Damage/Elastosis: chronic sun damage: dyspigmentation, telangiectasia, and wrinkling   Examination limited by: Clothing and Patient deferred removal     Left Temple 5mm atrophic tan pink papule    Assessment & Plan  (SKAP)   SKIN CANCER SCREENING PERFORMED TODAY.  BENIGN SKIN FINDINGS  - Lentigines  - Seborrheic keratoses  - Corn of the right foot  - Reassurance provided regarding the benign appearance of lesions noted on exam today; no treatment is indicated in the absence of symptoms/changes. - Reinforced importance of photoprotective strategies including liberal and frequent sunscreen use of a broad-spectrum SPF 30 or greater,  use of protective clothing, and sun avoidance for prevention of cutaneous malignancy and photoaging.  Counseled patient on the importance of regular self-skin monitoring as well as routine clinical skin examinations as scheduled.   ACTINIC DAMAGE - Chronic condition, secondary to cumulative UV/sun exposure - Recommend daily broad spectrum sunscreen SPF 30+ to sun-exposed areas, reapply every 2 hours as needed.  - Staying in the shade or wearing long sleeves, sun glasses (UVA+UVB protection) and wide brim hats (4-inch brim around the entire circumference of the hat) are also recommended for sun protection.  - Call for new or changing lesions.  Personal history of non melanoma skin cancer  and melanoma - Reviewed medical history for full details  - Reviewed sun protective measures as above - Encouraged full body skin exams  - states he is due in the next 6 months    Corn involving R foot  Discussed intractable plantar keratoses, mechanical causes, remedies (shoes, padding, orthotics, surgery).   Procedures, orders, diagnosis for this visit:  NEOPLASM OF UNCERTAIN BEHAVIOR Left Temple Skin / nail biopsy Type of biopsy: tangential   Informed consent: discussed and consent obtained   Timeout: patient name, date of birth, surgical site, and procedure verified   Procedure prep:  Patient was prepped and draped in usual sterile fashion Prep type:  Isopropyl alcohol  Anesthesia: the lesion was anesthetized in a standard fashion   Anesthetic:  1% lidocaine  w/ epinephrine 1-100,000 buffered w/ 8.4% NaHCO3 Instrument used: DermaBlade   Hemostasis achieved with: pressure and aluminum chloride   Outcome: patient tolerated procedure well   Post-procedure details: sterile dressing applied and wound care instructions given   Dressing type: bandage and  petrolatum    Specimen 1 - Surgical pathology Differential Diagnosis: Scar vs BCC vs Other   Check Margins: No  Neoplasm of uncertain behavior -      Skin / nail biopsy -     Surgical pathology; Standing    Return to clinic: Return for TBSE.  I, Emerick Ege, CMA am acting as scribe for Lauraine JAYSON Kanaris, MD.   Documentation: I have reviewed the above documentation for accuracy and completeness, and I agree with the above.  Lauraine JAYSON Kanaris, MD

## 2024-03-01 ENCOUNTER — Other Ambulatory Visit: Payer: Self-pay

## 2024-03-02 LAB — SURGICAL PATHOLOGY

## 2024-03-03 ENCOUNTER — Ambulatory Visit: Payer: Self-pay

## 2024-03-03 DIAGNOSIS — H25813 Combined forms of age-related cataract, bilateral: Secondary | ICD-10-CM | POA: Diagnosis not present

## 2024-03-03 NOTE — Progress Notes (Signed)
 LMTRC

## 2024-03-04 ENCOUNTER — Other Ambulatory Visit: Payer: Self-pay

## 2024-03-07 NOTE — Telephone Encounter (Signed)
-----   Message from Lauraine Kanaris, MD sent at 03/03/2024  8:46 AM EST -----  1. Skin, left temple :       SEBACEOUS GLAND HYPERPLASIA AND DERMAL SCAR    Please notify patient with below plan: Benign, observe.

## 2024-03-07 NOTE — Telephone Encounter (Signed)
 Discussed biopsy results with patient

## 2024-03-30 ENCOUNTER — Other Ambulatory Visit: Payer: Self-pay

## 2024-04-27 ENCOUNTER — Other Ambulatory Visit: Payer: Self-pay

## 2024-04-27 MED ORDER — LISINOPRIL-HYDROCHLOROTHIAZIDE 20-12.5 MG PO TABS
1.0000 | ORAL_TABLET | Freq: Every day | ORAL | 3 refills | Status: AC
  Filled 2024-04-27: qty 30, 30d supply, fill #0

## 2024-04-29 ENCOUNTER — Other Ambulatory Visit: Payer: Self-pay

## 2024-08-29 ENCOUNTER — Ambulatory Visit
